# Patient Record
Sex: Female | Born: 1975 | Race: White | Hispanic: No | Marital: Married | Smoking: Former smoker
Health system: Southern US, Community
[De-identification: ages and names within clinical notes are randomized; demographics above are authoritative.]

## PROBLEM LIST (undated history)

## (undated) DIAGNOSIS — O24419 Gestational diabetes mellitus in pregnancy, unspecified control: Secondary | ICD-10-CM

## (undated) DIAGNOSIS — G4730 Sleep apnea, unspecified: Secondary | ICD-10-CM

## (undated) DIAGNOSIS — F419 Anxiety disorder, unspecified: Secondary | ICD-10-CM

## (undated) DIAGNOSIS — I10 Essential (primary) hypertension: Secondary | ICD-10-CM

## (undated) DIAGNOSIS — E669 Obesity, unspecified: Secondary | ICD-10-CM

## (undated) DIAGNOSIS — O1490 Unspecified pre-eclampsia, unspecified trimester: Secondary | ICD-10-CM

## (undated) DIAGNOSIS — I4819 Other persistent atrial fibrillation: Secondary | ICD-10-CM

## (undated) DIAGNOSIS — E282 Polycystic ovarian syndrome: Secondary | ICD-10-CM

## (undated) HISTORY — PX: OVARIAN CYST REMOVAL: SHX89

## (undated) HISTORY — PX: SALPINGOOPHORECTOMY: SHX82

---

## 2005-05-25 ENCOUNTER — Ambulatory Visit: Payer: Self-pay | Admitting: Obstetrics & Gynecology

## 2005-10-12 ENCOUNTER — Ambulatory Visit: Payer: Self-pay | Admitting: Internal Medicine

## 2005-10-15 ENCOUNTER — Ambulatory Visit: Payer: Self-pay | Admitting: Internal Medicine

## 2005-11-14 ENCOUNTER — Ambulatory Visit: Payer: Self-pay | Admitting: Internal Medicine

## 2007-03-10 ENCOUNTER — Emergency Department: Payer: Self-pay | Admitting: Emergency Medicine

## 2007-03-10 IMAGING — US US PELV - US TRANSVAGINAL
1 series · 17 of 25 positions shown · non-contrast
Comparison: none

REASON FOR EXAM: heavy vaginal bleeding + wbc elevation evalute uterus,
ovaries
COMMENTS:   LMP: Now

[Series 1: us pelv - us transvaginal · 17 of 27 slices shown]
[im 1/27]
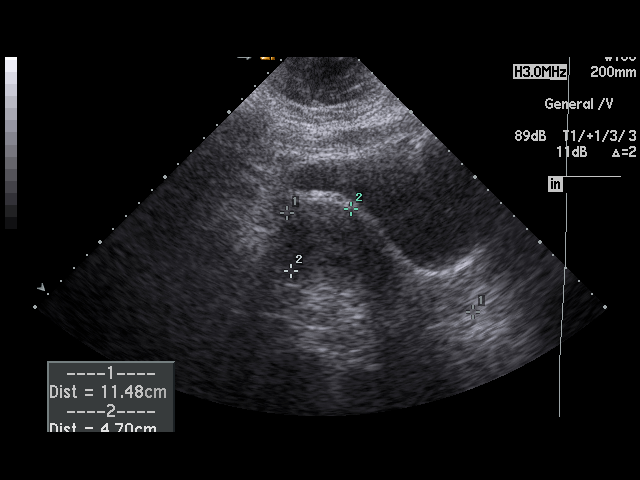
[im 3/27]
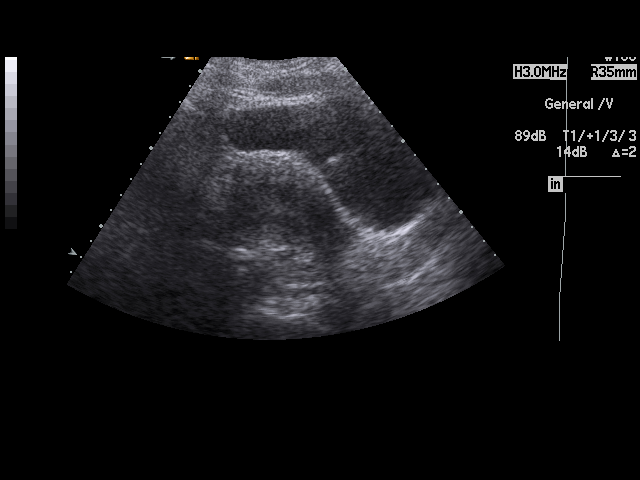
[im 4/27]
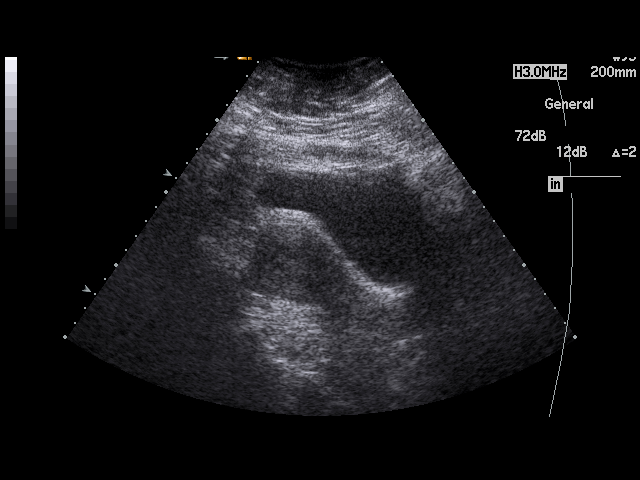
[im 6/27]
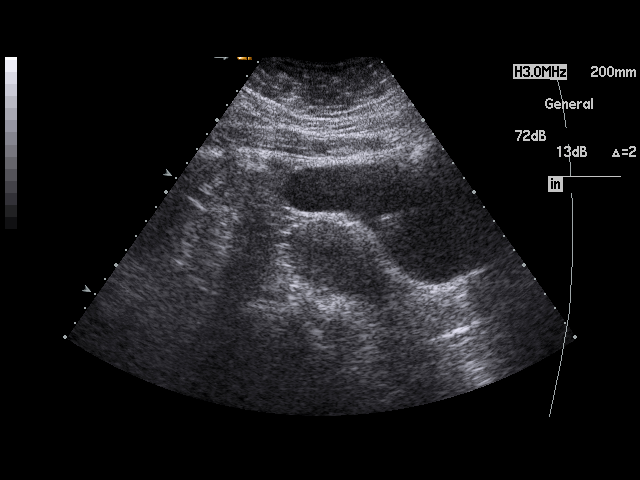
[im 7/27]
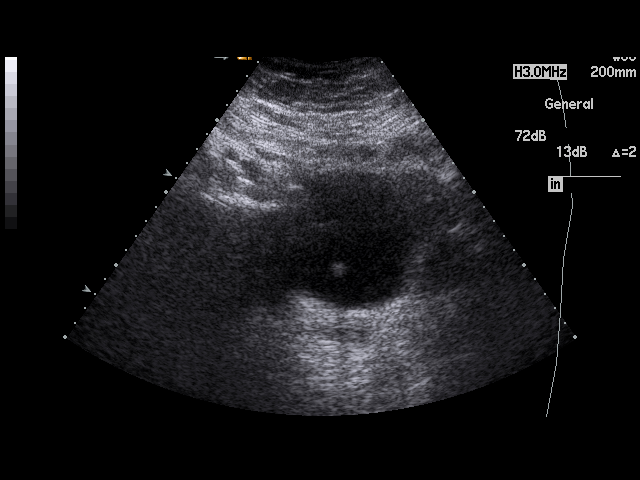
[im 9/27]
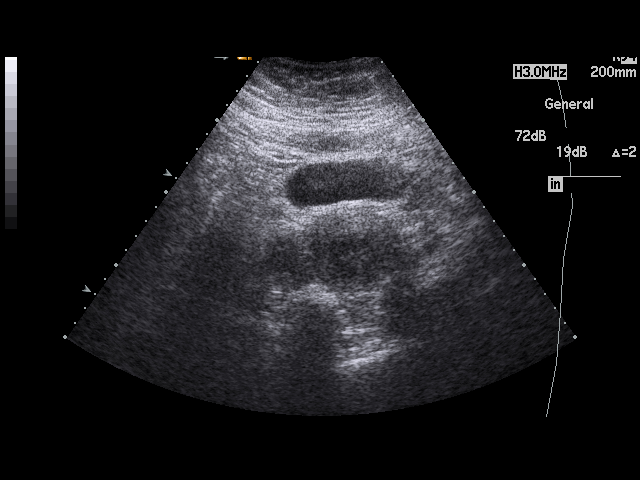
[im 10/27]
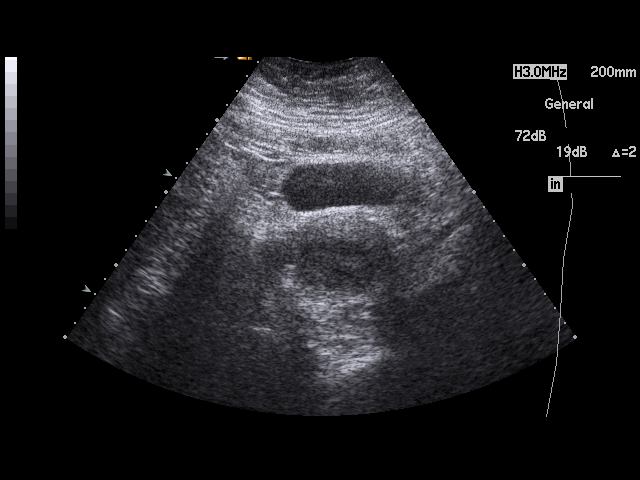
[im 12/27]
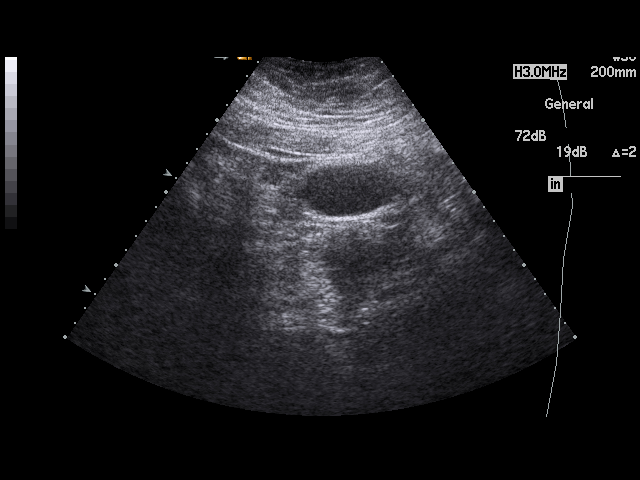
[im 14/27]
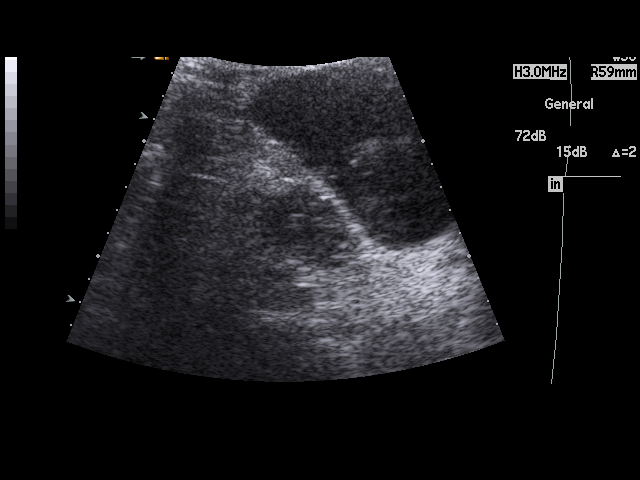
[im 15/27]
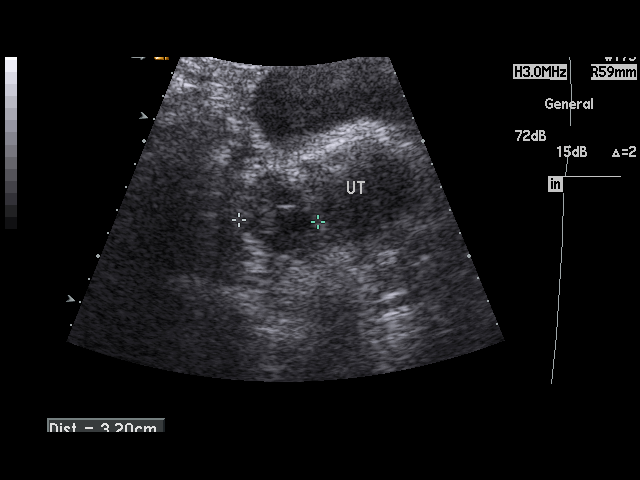
[im 17/27]
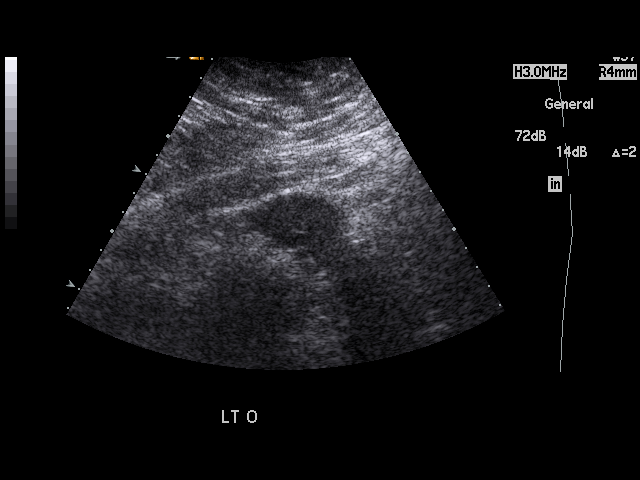
[im 18/27]
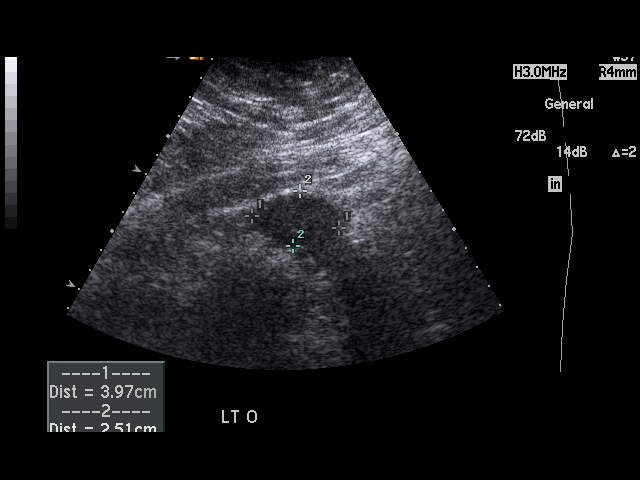
[im 20/27]
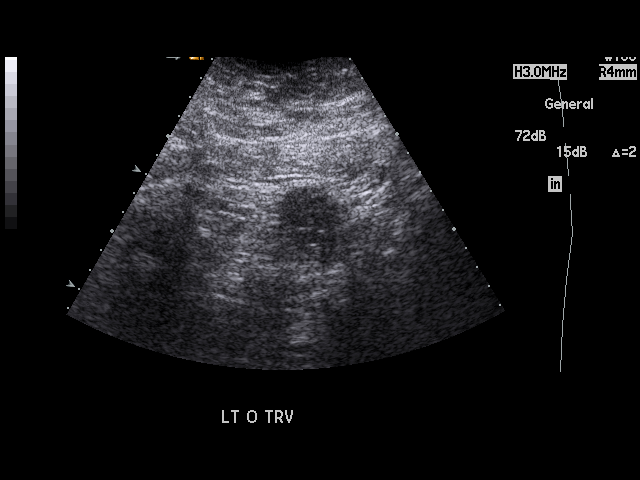
[im 21/27]
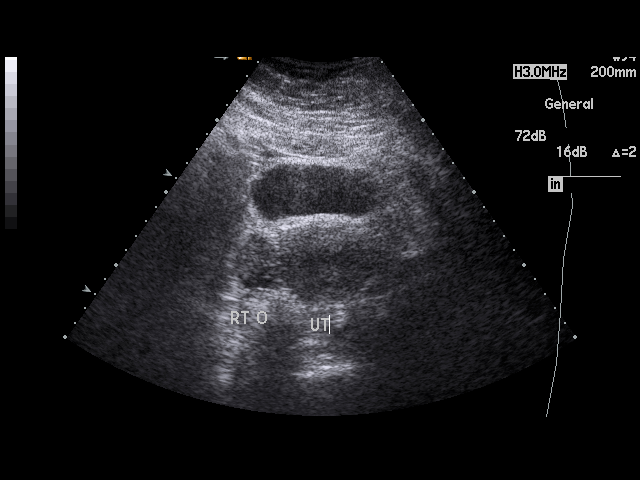
[im 23/27]
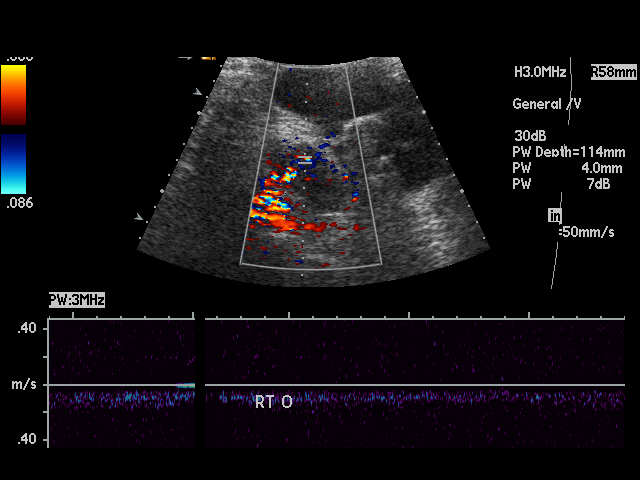
[im 24/27]
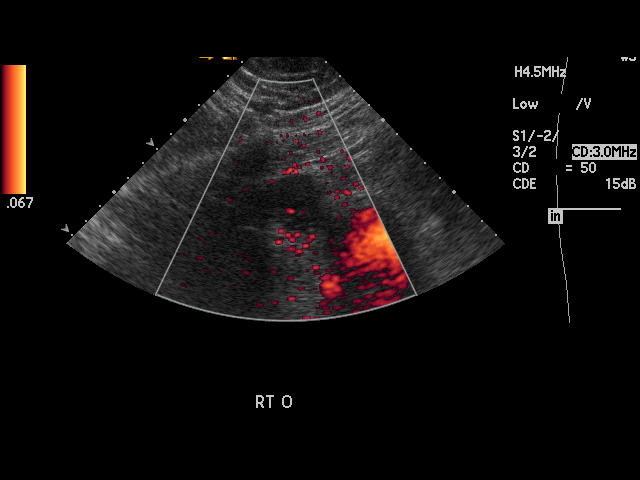
[im 27/27]
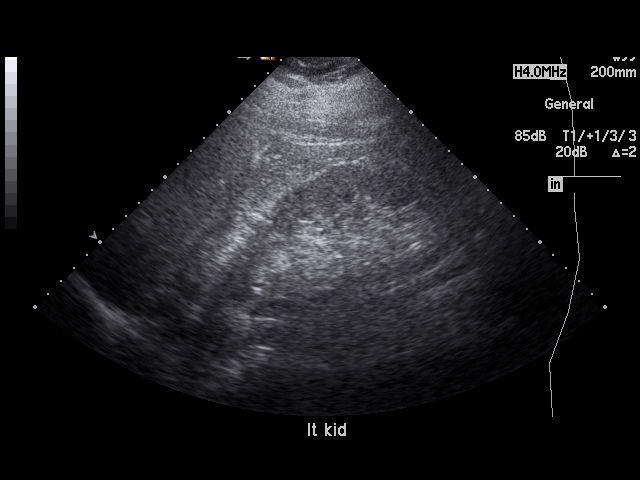

[17 of 25 positions shown; findings below may reference images not displayed]

PROCEDURE:     US  - US PELVIS MASS EXAM  - [DATE] [DATE] [DATE]  [DATE]

RESULT:     The size, shape and echotexture of the uterus is normal.  The
uterus measures 11.4 x 4.7 x 4.8 cm.  The ovaries are unremarkable measuring
4 x 2.7 x 3.2 cm on the RIGHT and 4.0 x 2.5 x 2.7 cm on the LEFT. There is
no hydronephrosis. No pelvic fluid collections are noted.
IMPRESSION: Normal pelvic ultrasound.

## 2011-04-19 ENCOUNTER — Emergency Department: Payer: Self-pay | Admitting: *Deleted

## 2011-05-18 ENCOUNTER — Ambulatory Visit: Payer: Self-pay | Admitting: Family

## 2020-08-21 ENCOUNTER — Encounter: Payer: Self-pay | Admitting: Emergency Medicine

## 2020-08-21 ENCOUNTER — Emergency Department: Payer: BC Managed Care – PPO

## 2020-08-21 ENCOUNTER — Observation Stay
Admission: EM | Admit: 2020-08-21 | Discharge: 2020-08-22 | Disposition: A | Discharge status: Home / Self Care | Payer: BC Managed Care – PPO | Attending: Internal Medicine | Admitting: Internal Medicine

## 2020-08-21 ENCOUNTER — Other Ambulatory Visit: Payer: Self-pay

## 2020-08-21 ENCOUNTER — Ambulatory Visit
Admission: EM | Admit: 2020-08-21 | Discharge: 2020-08-21 | Disposition: A | Discharge status: Other Acute Inpatient Hospital | Payer: BC Managed Care – PPO

## 2020-08-21 DIAGNOSIS — E876 Hypokalemia: Secondary | ICD-10-CM | POA: Diagnosis not present

## 2020-08-21 DIAGNOSIS — Z9189 Other specified personal risk factors, not elsewhere classified: Secondary | ICD-10-CM | POA: Diagnosis not present

## 2020-08-21 DIAGNOSIS — R079 Chest pain, unspecified: Secondary | ICD-10-CM | POA: Diagnosis not present

## 2020-08-21 DIAGNOSIS — I248 Other forms of acute ischemic heart disease: Secondary | ICD-10-CM | POA: Diagnosis not present

## 2020-08-21 DIAGNOSIS — I160 Hypertensive urgency: Secondary | ICD-10-CM | POA: Diagnosis present

## 2020-08-21 DIAGNOSIS — Z79899 Other long term (current) drug therapy: Secondary | ICD-10-CM | POA: Insufficient documentation

## 2020-08-21 DIAGNOSIS — I4891 Unspecified atrial fibrillation: Secondary | ICD-10-CM

## 2020-08-21 DIAGNOSIS — Z20822 Contact with and (suspected) exposure to covid-19: Secondary | ICD-10-CM | POA: Diagnosis not present

## 2020-08-21 DIAGNOSIS — I161 Hypertensive emergency: Secondary | ICD-10-CM

## 2020-08-21 DIAGNOSIS — D72829 Elevated white blood cell count, unspecified: Secondary | ICD-10-CM

## 2020-08-21 DIAGNOSIS — R0609 Other forms of dyspnea: Secondary | ICD-10-CM | POA: Diagnosis present

## 2020-08-21 DIAGNOSIS — E119 Type 2 diabetes mellitus without complications: Secondary | ICD-10-CM | POA: Insufficient documentation

## 2020-08-21 DIAGNOSIS — Z87891 Personal history of nicotine dependence: Secondary | ICD-10-CM | POA: Diagnosis not present

## 2020-08-21 DIAGNOSIS — R0600 Dyspnea, unspecified: Secondary | ICD-10-CM

## 2020-08-21 DIAGNOSIS — R0602 Shortness of breath: Secondary | ICD-10-CM

## 2020-08-21 DIAGNOSIS — Z28310 Unvaccinated for covid-19: Secondary | ICD-10-CM | POA: Insufficient documentation

## 2020-08-21 DIAGNOSIS — I10 Essential (primary) hypertension: Secondary | ICD-10-CM | POA: Insufficient documentation

## 2020-08-21 DIAGNOSIS — E282 Polycystic ovarian syndrome: Secondary | ICD-10-CM

## 2020-08-21 HISTORY — DX: Obesity, unspecified: E66.9

## 2020-08-21 HISTORY — DX: Sleep apnea, unspecified: G47.30

## 2020-08-21 HISTORY — DX: Unspecified pre-eclampsia, unspecified trimester: O14.90

## 2020-08-21 HISTORY — DX: Essential (primary) hypertension: I10

## 2020-08-21 HISTORY — DX: Polycystic ovarian syndrome: E28.2

## 2020-08-21 HISTORY — DX: Other persistent atrial fibrillation: I48.19

## 2020-08-21 HISTORY — DX: Anxiety disorder, unspecified: F41.9

## 2020-08-21 HISTORY — DX: Gestational diabetes mellitus in pregnancy, unspecified control: O24.419

## 2020-08-21 LAB — CBC
HCT: 44.1 % (ref 36.0–46.0)
Hemoglobin: 14.2 g/dL (ref 12.0–15.0)
MCH: 26.4 pg (ref 26.0–34.0)
MCHC: 32.2 g/dL (ref 30.0–36.0)
MCV: 82 fL (ref 80.0–100.0)
Platelets: 443 10*3/uL — ABNORMAL HIGH (ref 150–400)
RBC: 5.38 MIL/uL — ABNORMAL HIGH (ref 3.87–5.11)
RDW: 14.9 % (ref 11.5–15.5)
WBC: 17.9 10*3/uL — ABNORMAL HIGH (ref 4.0–10.5)
nRBC: 0 % (ref 0.0–0.2)

## 2020-08-21 LAB — BASIC METABOLIC PANEL
Anion gap: 12 (ref 5–15)
BUN: 15 mg/dL (ref 6–20)
CO2: 23 mmol/L (ref 22–32)
Calcium: 8.9 mg/dL (ref 8.9–10.3)
Chloride: 104 mmol/L (ref 98–111)
Creatinine, Ser: 0.9 mg/dL (ref 0.44–1.00)
GFR, Estimated: >60 mL/min (ref >60)
Glucose, Bld: 123 mg/dL — ABNORMAL HIGH (ref 70–99)
Potassium: 3.7 mmol/L (ref 3.5–5.1)
Sodium: 139 mmol/L (ref 135–145)

## 2020-08-21 LAB — TROPONIN I (HIGH SENSITIVITY)
Troponin I (High Sensitivity): 50 ng/L — ABNORMAL HIGH (ref <18)
Troponin I (High Sensitivity): 49 ng/L — ABNORMAL HIGH (ref <18)

## 2020-08-21 LAB — RESP PANEL BY RT-PCR (FLU A&B, COVID) ARPGX2
Influenza A by PCR: NEGATIVE
Influenza B by PCR: NEGATIVE
SARS Coronavirus 2 by RT PCR: NEGATIVE

## 2020-08-21 LAB — D-DIMER, QUANTITATIVE: D-Dimer, Quant: 1.11 ug/mL-FEU — ABNORMAL HIGH (ref 0.00–0.50)

## 2020-08-21 LAB — POC URINE PREG, ED: Preg Test, Ur: NEGATIVE

## 2020-08-21 LAB — BRAIN NATRIURETIC PEPTIDE: B Natriuretic Peptide: 483.7 pg/mL — ABNORMAL HIGH (ref 0.0–100.0)

## 2020-08-21 LAB — URINALYSIS, COMPLETE (UACMP) WITH MICROSCOPIC
Bacteria, UA: NONE SEEN
Bilirubin Urine: NEGATIVE
Glucose, UA: 50 mg/dL — AB
Ketones, ur: NEGATIVE mg/dL
Leukocytes,Ua: NEGATIVE
Nitrite: NEGATIVE
Protein, ur: >=300 mg/dL — AB
Specific Gravity, Urine: 1.008 (ref 1.005–1.030)
pH: 7 (ref 5.0–8.0)

## 2020-08-21 LAB — PROCALCITONIN: Procalcitonin: <0.1 ng/mL

## 2020-08-21 LAB — HIV ANTIBODY (ROUTINE TESTING W REFLEX): HIV Screen 4th Generation wRfx: NONREACTIVE

## 2020-08-21 LAB — T4, FREE: Free T4: 1.12 ng/dL (ref 0.61–1.12)

## 2020-08-21 LAB — TSH: TSH: 3.609 u[IU]/mL (ref 0.350–4.500)

## 2020-08-21 MED ORDER — ENOXAPARIN SODIUM 80 MG/0.8ML ~~LOC~~ SOLN
0.5000 mg/kg | SUBCUTANEOUS | Status: DC
  Administered 2020-08-21: 72.5 mg via SUBCUTANEOUS
  Filled 2020-08-21 (×2): qty 0.8

## 2020-08-21 MED ORDER — ACETAMINOPHEN 325 MG PO TABS: 650.0000 mg | ORAL_TABLET | Freq: Four times a day (QID) | ORAL | Status: DC | PRN

## 2020-08-21 MED ORDER — ONDANSETRON HCL 4 MG/2ML IJ SOLN: 4.0000 mg | Freq: Four times a day (QID) | INTRAMUSCULAR | Status: DC | PRN

## 2020-08-21 MED ORDER — DILTIAZEM HCL-DEXTROSE 125-5 MG/125ML-% IV SOLN (PREMIX)
5.0000 mg/h | INTRAVENOUS | Status: DC
  Administered 2020-08-21: 5 mg/h via INTRAVENOUS
  Administered 2020-08-22: 12.5 mg/h via INTRAVENOUS
  Filled 2020-08-21 (×2): qty 125

## 2020-08-21 MED ORDER — ADULT MULTIVITAMIN W/MINERALS CH
1.0000 | ORAL_TABLET | Freq: Every day | ORAL | Status: DC
  Administered 2020-08-21 – 2020-08-22 (×2): 1 via ORAL
  Filled 2020-08-21 (×2): qty 1

## 2020-08-21 MED ORDER — ONDANSETRON HCL 4 MG PO TABS: 4.0000 mg | ORAL_TABLET | Freq: Four times a day (QID) | ORAL | Status: DC | PRN

## 2020-08-21 MED ORDER — ACETAMINOPHEN 650 MG RE SUPP: 650.0000 mg | Freq: Four times a day (QID) | RECTAL | Status: DC | PRN

## 2020-08-21 MED ORDER — FUROSEMIDE 10 MG/ML IJ SOLN
40.0000 mg | Freq: Once | INTRAMUSCULAR | Status: AC
  Administered 2020-08-21: 40 mg via INTRAVENOUS
  Filled 2020-08-21: qty 4

## 2020-08-21 MED ORDER — LISINOPRIL 20 MG PO TABS
20.0000 mg | ORAL_TABLET | Freq: Every day | ORAL | Status: DC
  Administered 2020-08-21 – 2020-08-22 (×2): 20 mg via ORAL
  Filled 2020-08-21: qty 2
  Filled 2020-08-21: qty 1

## 2020-08-21 MED ORDER — HYDRALAZINE HCL 20 MG/ML IJ SOLN: 10.0000 mg | Freq: Once | INTRAMUSCULAR | Status: DC

## 2020-08-21 MED ORDER — LABETALOL HCL 5 MG/ML IV SOLN: 10.0000 mg | INTRAVENOUS | Status: DC | PRN

## 2020-08-21 NOTE — ED Triage Notes (Signed)
Pt BIB EMS from [facility], pt endorses shortness of breath x3 days, started with chest pressure this morning. In urgent care pt found to be in new onset a-fib with RVR with HR of 160-190. Pt given 5mg  of metoprolol on EMS arrival with improvement in HR to 160-190s. Pt's HR 120-140s on arrival to ED. Pt also found to be hypertensive with EMS at 260/150, pt has hx of HTN and has been off BP medications since December. Pt states she ran out of BP medications and never went back to primary to refill.

## 2020-08-21 NOTE — H&P (Addendum)
History and Physical   Joyce Perkins XLK:[id number] DOB: September 08, 1975 DOA: 08/21/2020  PCP: [facility] [employer]  Patient coming from: urgent care center  I have personally briefly reviewed patient's old medical records in [place].  Chief Concern: Shortness of breath  HPI: Joyce Perkins is a 45 y.o. female with medical history significant for history of HELLP syndrome, history of chronic hypertension with superimposed pre-eclampsia, PCOS, history of 2 cesarean section deliveries (2006 and 2014), postpartum depression, morbid obesity with BMI greater than 50, hypertension, history of chronic persistent leukocytosis post delivery, history of NIDDM and gestational DM, history of tobacco use quit when she was 45 years old, presents to the emergency department for chief concerns of shortness of breath.  She reports the shortness of breath has been present for 3 days.  She states it is worse with exertion including daily activities around the house.  She denies bilateral lower extremity swelling.  She reports that previously she lost 65 pounds intentionally and then gained all 65 pounds back.  Furthermore she endorses hair loss and takes biotin.  She states she was worked up for hypothyroid and that was negative.  She endorses polyuria and denies dysuria.  She states she is sexually active.  She denies chest pain, vision changes, fever, cough, abdominal pain, diarrhea, dysuria.  She does endorse a headache that has been persistent since this morning.  Since being in the emergency department and after medication initiation she states that the headache has improved.  Of note, she endorses that she stopped taking her blood pressure in December 2021(combination lisinopril - hctz 10-12.5 mg daily) and her depression medication, zoloft 50 mg daily. She states the reason being she didn't go back to her doctor to priortized her health and once her refill ran out, she stopped taking her  medications.  She denies SI and HI.  At bedside patient was able to tell me her name, her age, current location she does not appear to be in acute distress.  She is tearful when I discussed extensively how important it is for her to be compliant with her blood pressure medication.  She was able to move all extremities without any difficulty.  No slurred speech or facial asymmetry noted.  Social history: Lives with husband and two children. quit smoking 10 years old, 1 pack per week back then. She had one infrequent etoh use, last drink was over the weekend, 1 strawberry daiquiri. 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   Vaccination: She is not vaccinated for covid-19   ROS: Constitutional: no weight change, no fever ENT/Mouth: no sore throat, no rhinorrhea Eyes: no eye pain, no vision changes Cardiovascular: no chest pain, + dyspnea,  no edema, + palpitations Respiratory: no cough, no sputum, no wheezing Gastrointestinal: no nausea, no vomiting, no diarrhea, no constipation Genitourinary: no urinary incontinence, no dysuria, no hematuria, + polyuria Musculoskeletal: no arthralgias, no myalgias Skin: no skin lesions, no pruritus, Neuro: + weakness, no loss of consciousness, no syncope Psych: no anxiety, no depression, no decrease appetite Heme/Lymph: no bruising, no bleeding  ED Course: Discussed with ED provider, patient requiring hospitalization for hypertensive urgency and atrial fibrillation with RVR.  Vitals in the emergency department was markable for temperature of 98.8, respiration rate of 27, heart rate 122, blood pressure 225/145, patient SPO2 saturation of 95% on room air.    Emergency medicine provider started patient on  Cardizem drip.  Labs in the emergency department was remarkable for sodium of 139, potassium 3.7, serum creatinine of 0.90, nonfasting blood glucose of 123, EGFR greater than 60, WBC 17.9, hemoglobin  14.2, platelets 443.  Assessment/Plan  Principal Problem:   Atrial fibrillation with RVR (HCC) Active Problems:   Hypertensive urgency   Essential hypertension   Leukocytosis   Obesity, morbid, BMI 50 or higher (HCC)   At risk for extreme obesity with alveolar hypoventilation   PCOS (polycystic ovarian syndrome)   # Atrial fibrillation with RVR-etiology work-up in progress, however likely patient stopped taking her blood pressure medicine and is now in hypertensive urgency Hypertensive urgency-secondary to patient not taking her medications since December 2021 -ED provider started patient on Cardizem drip, we will continue -Maintain goal heart rate of heart rate of 75-105 and/or SBP of 150-175 -Checking TSH, BNP, UA, D-dimer-we will order the appropriate imaging with abnormal labs -As needed medications: Labetalol 10 mg IV every 2 hours as needed for SBP greater than 180, 1 dose ordered -Admit to progressive cardiac, observation, with telemetry  # Hypertension - etiology is multifactorial including primary hypertension, morbid obesity, and endocrine dysfunction given PCOS -previously on combination lisinopril-HCTZ 10-12.5 mg daily -Resumed lisinopril 20 mg daily -If patient does not improve, would recommend consideration given to renal US artery duplex complete to assess for renal artery stenosis  # Elevated troponin-suspect secondary to hypertensive urgency and atrial fibrillation with RVR -Initial high-sensitivity troponin is 50, will continue to follow second troponin  # Elevated BNP - suspect heart failure component, lasix 40 mg IV once ordered  # Chronic leukocytosis-patient states she continued to have leukocytosis post delivery in 2006 and was followed previously by oncologist specialist, Dr. Lorina Rabon -WBC on presentation was 17.9 -WBC in 2012-2014 in [place] shows WBC levels ranging from 11.7-18.6 -However given atrial fibrillation with RVR and polyuria, I will check  UA and procal  # History of non-insulin-dependent diabetes mellitus/gestational diabetes-was previously taking glyburide 2.5 mg twice daily, recommend outpatient follow-up with PCP  # At risk for hypoventilation obesity syndrome-CPAP nightly ordered, would recommend patient follow with primary care provider closely for need of sleep study outpatient  # PCOS - outpatient obgyn follow-up  # Morbid obesity-recommend diet and exercise outpatient weight loss  # Persistent hair loss-checking TSH  # Healthcare maintenance counseling-extensive discussion regarding taking care of her own health, including healthy weight loss and taking blood pressure medication was done during this hospitalization -Patient endorses understanding, agreement, and compliance  Chart reviewed.   DVT prophylaxis: Enoxaparin 72.5 mg subcutaneous every 24 hours, TED hose Code Status: Full code Diet: Heart healthy Family Communication: No Disposition Plan: Pending clinical course Consults called: None at this time Admission status: Observation, progressive cardiac with telemetry  Past Medical History:  Diagnosis Date  . Hypertension   . Obesity   . Sleep apnea    Past Surgical History:  Procedure Laterality Date  . CESAREAN SECTION     x 2  . OVARIAN CYST REMOVAL Left   . SALPINGOOPHORECTOMY Left    Social History:  reports that she quit smoking about 20 years ago. She has never used smokeless tobacco. She reports previous alcohol use. She reports previous drug use.  No Known Allergies Family History  Problem Relation Age of Onset  . Hypertension Mother   . COPD Mother   . Diabetes Mother   . Heart disease Mother   . Other Father        unknown  medical history   Family history: Family history reviewed and not pertinent  Prior to Admission medications   Medication Sig Start Date End Date Taking? Authorizing Provider  BIOTIN PO Take 1 tablet by mouth daily.    [provider]  Multiple  Vitamin (MULTIVITAMIN) tablet Take 1 tablet by mouth daily.    [provider]  VITAMIN D PO Take 1 tablet by mouth daily.    [provider]   Physical Exam: Vitals:   08/21/20 1515 08/21/20 1530 08/21/20 1545 08/21/20 1550  BP:  (!) 174/95    Pulse:    (!) 109  Resp: (!) 27 (!) 26    Temp:      TempSrc:      SpO2: 94% 93% 99% 93%  Weight:      Height:       Constitutional: appears older than chronological age, NAD, calm, comfortable Eyes: PERRL, lids and conjunctivae normal ENMT: Mucous membranes are moist. Posterior pharynx clear of any exudate or lesions. Age-appropriate dentition. Hearing appropriate Neck: normal, supple, no masses, no thyromegaly Respiratory: clear to auscultation bilaterally, no wheezing, no crackles. Normal respiratory effort. No accessory muscle use.  Cardiovascular: Regular rate and rhythm, no murmurs / rubs / gallops. No extremity edema. 2+ pedal pulses. No carotid bruits.  Abdomen: Morbid obesity, no tenderness, no masses palpated, no hepatosplenomegaly. Bowel sounds positive.  Musculoskeletal: no clubbing / cyanosis. No joint deformity upper and lower extremities. Good ROM, no contractures, no atrophy. Normal muscle tone.  Skin: no rashes, lesions, ulcers. No induration Neurologic: Sensation intact. Strength 5/5 in all 4.  Psychiatric: Normal judgment and insight. Alert and oriented x 3. Normal mood.   EKG: independently reviewed, showing atrial fibrillation with rate of 147, QTc 428  Chest x-ray on Admission: I personally reviewed and I agree with radiologist reading as below.  DG Chest 2 View  Result Date: 08/21/2020 CLINICAL DATA:  Chest pressure.  New onset atrial fibrillation. EXAM: CHEST - 2 VIEW COMPARISON:  Non FINDINGS: Mild enlargement of the cardiopericardial silhouette with indistinct pulmonary vasculature suggesting pulmonary venous hypertension. No Kerley B lines are identified. Subtle thickening of the fissures on the  lateral projection. No blunting of the costophrenic angles. Lower thoracic spondylosis. IMPRESSION: 1. Mild enlargement of the cardiopericardial silhouette with pulmonary venous hypertension but no overt edema at this time. No pleural effusion. Electronically Signed   By: Van Clines M.D.   On: 08/21/2020 14:29   Labs on Admission: I have personally reviewed following labs  CBC: Recent Labs  Lab 08/21/20 1319  WBC 17.9*  HGB 14.2  HCT 44.1  MCV 82.0  PLT 993*   Basic Metabolic Panel: Recent Labs  Lab 08/21/20 1319  NA 139  K 3.7  CL 104  CO2 23  GLUCOSE 123*  BUN 15  CREATININE 0.90  CALCIUM 8.9   GFR: Estimated Creatinine Clearance: 113.3 mL/min (by C-G formula based on SCr of 0.9 mg/dL).  CRITICAL CARE Performed by: Criss Alvine  Total critical care time: 35 minutes  Critical care time was exclusive of separately billable procedures and treating other patients.  Critical care was necessary to treat or prevent imminent or life-threatening deterioration. Hypertensive urgency, atrial fibrillation with rvr, circulatory failure   Critical care was time spent personally by me on the following activities: development of treatment plan with patient and/or surrogate as well as nursing, discussions with consultants, evaluation of patient's response to treatment, examination of patient, obtaining history from patient or surrogate,  ordering and performing treatments and interventions, ordering and review of laboratory studies, ordering and review of radiographic studies, pulse oximetry and re-evaluation of patient's condition.  Germain Koopmann N Bridgitte Felicetti D.O. [facility]  If 7PM-7AM, please contact overnight-coverage provider If 7AM-7PM, please contact day coverage provider [email or web address]  08/21/2020, 3:58 PM

## 2020-08-21 NOTE — ED Notes (Signed)
Patient is being discharged from the Urgent Care and sent to the Emergency Department via EMS. Per Athena Masse, PA, patient is in need of higher level of care due to Afib with RVR and elevated BP of 260/98. Patient is aware and verbalizes understanding of plan of care.  Vitals:   08/21/20 1207 08/21/20 1217  BP:    Pulse: (S) (!) 151   Resp:    Temp:    SpO2: 93% 97%

## 2020-08-21 NOTE — ED Notes (Addendum)
Pt requesting to use restroom, pt advised that if possible should wait until HR more stable.

## 2020-08-21 NOTE — ED Provider Notes (Addendum)
[facility]    CSN: [id number] Arrival date & time: 08/21/20  1138      History   Chief Complaint Chief Complaint  Patient presents with  . Shortness of Breath    HPI Joyce Perkins is a 45 y.o. female presenting for 3-day history of fatigue and dyspnea on exertion.  Patient states that she has not felt well for a while before that but everything seems to have climax over the past 3 days and especially today.  She says earlier today she felt like her heart was racing.  She is also had some "chest pressure" throughout the day today.  Additionally she has felt some numbness and tingling in her hands and toes.  Patient also admits to a headache currently.  She denies any recent illness.  No fever, cough, congestion, abdominal pain, nausea/vomiting.  Symptoms do improve a little bit when she sits and rests.  She does have a medical history significant for hypertension but has not been on medication for approximately 5 months.  Other history significant for OSA and morbid obesity.  Patient states that she does sleep with the BiPAP and this morning she still woke up gasping for breath.  Patient has no history of arrhythmia or cardiac problems.  No history of COPD but is a former smoker.  No history of asthma.  Patient has no other concerns.  HPI  Past Medical History:  Diagnosis Date  . Hypertension   . Obesity   . Sleep apnea     There are no problems to display for this patient.   Past Surgical History:  Procedure Laterality Date  . CESAREAN SECTION     x 2  . OVARIAN CYST REMOVAL Left   . SALPINGOOPHORECTOMY Left     OB History   No obstetric history on file.      Home Medications    Prior to Admission medications   Medication Sig Start Date End Date Taking? Authorizing Provider  BIOTIN PO Take 1 tablet by mouth daily.   Yes [provider]  Multiple Vitamin (MULTIVITAMIN) tablet Take 1 tablet by mouth daily.   Yes [provider]  VITAMIN  D PO Take 1 tablet by mouth daily.   Yes [provider]    Family History Family History  Problem Relation Age of Onset  . Hypertension Mother   . COPD Mother   . Diabetes Mother   . Heart disease Mother   . Other Father        unknown medical history    Social History Social History   Tobacco Use  . Smoking status: Former Smoker    Quit date: 08/21/2000    Years since quitting: 20.0  . Smokeless tobacco: Never Used  Vaping Use  . Vaping Use: Never used  Substance Use Topics  . Alcohol use: Not Currently  . Drug use: Not Currently     Allergies   Patient has no known allergies.   Review of Systems Review of Systems  Constitutional: Positive for diaphoresis and fatigue. Negative for fever.  HENT: Negative for congestion.   Eyes: Negative for photophobia and visual disturbance.  Respiratory: Positive for shortness of breath. Negative for cough and wheezing.   Cardiovascular: Positive for chest pain and palpitations. Negative for leg swelling.  Gastrointestinal: Negative for abdominal pain, nausea and vomiting.  Musculoskeletal: Positive for back pain (yesterday, but not currently).  Neurological: Positive for numbness and headaches. Negative for dizziness, syncope and weakness.  Physical Exam Triage Vital Signs ED Triage Vitals  Enc Vitals Group     BP 08/21/20 1159 (!) 260/98     Pulse Rate 08/21/20 1159 94     Resp 08/21/20 1159 (!) 24     Temp 08/21/20 1159 98.1 F (36.7 C)     Temp Source 08/21/20 1159 Oral     SpO2 08/21/20 1159 99 %     Weight 08/21/20 1153 (!) 315 lb (142.9 kg)     Height 08/21/20 1153 5\' 4"  (1.626 m)     Head Circumference --      Peak Flow --      Pain Score 08/21/20 1153 0     Pain Loc --      Pain Edu? --      Excl. in GC? --    No data found.  Updated Vital Signs BP (!) 260/98 (BP Location: Right Arm) Comment (BP Location): forearm  Pulse (S) (!) 151 Comment: with ambulation  Temp 98.1 F (36.7 C) (Oral)    Resp (!) 24   Ht 5\' 4"  (1.626 m)   Wt (!) 315 lb (142.9 kg)   LMP 07/21/2020 (Exact Date)   SpO2 97%   BMI 54.07 kg/m       Physical Exam Vitals and nursing note reviewed.  Constitutional:      General: She is in acute distress (mild to moderate distress related to increased RR as well as being very tearful and "scared").     Appearance: Normal appearance. She is well-developed. She is obese. She is diaphoretic. She is not ill-appearing or toxic-appearing.  HENT:     Head: Normocephalic and atraumatic.     Nose: Nose normal.     Mouth/Throat:     Mouth: Mucous membranes are moist.     Pharynx: Oropharynx is clear.  Eyes:     General: No scleral icterus.       Right eye: No discharge.        Left eye: No discharge.     Conjunctiva/sclera: Conjunctivae normal.  Cardiovascular:     Rate and Rhythm: Tachycardia present. Rhythm irregular.     Heart sounds: Normal heart sounds. No murmur heard.   Pulmonary:     Effort: Pulmonary effort is normal. No respiratory distress.     Breath sounds: Normal breath sounds. No wheezing, rhonchi or rales.  Musculoskeletal:     Cervical back: Neck supple.  Neurological:     General: No focal deficit present.     Mental Status: She is alert and oriented to person, place, and time. Mental status is at baseline.     Cranial Nerves: No cranial nerve deficit.     Motor: No weakness.     Gait: Gait normal.  Psychiatric:        Mood and Affect: Mood normal.        Behavior: Behavior normal.        Thought Content: Thought content normal.      UC Treatments / Results  Labs (all labs ordered are listed, but only abnormal results are displayed) Labs Reviewed - No data to display  EKG   Radiology No results found.  Procedures ED EKG  Date/Time: 08/21/2020 12:48 PM Performed by: 09/20/2020, PA-C Authorized by: 10/21/2020, PA-C   Previous ECG:    Previous ECG:  Unavailable Interpretation:    Interpretation: abnormal    Rate:    ECG rate:  147   ECG rate assessment:  tachycardic   Rhythm:    Rhythm: atrial fibrillation   Ectopy:    Ectopy: none   QRS:    QRS axis:  Normal   QRS intervals:  Normal ST segments:    ST segments:  Non-specific T waves:    T waves: normal   Comments:     Atrial fibrillation in RVR, tachycardia, Nonspecific ST abnormality   (including critical care time)  Medications Ordered in UC Medications - No data to display  Initial Impression / Assessment and Plan / UC Course  I have reviewed the triage vital signs and the nursing notes.  Pertinent labs & imaging results that were available during my care of the patient were reviewed by me and considered in my medical decision making (see chart for details).   45 year old female presenting in distress for shortness of breath and dyspnea on exertion as well as fatigue x3 days, worsening today.  Has had some chest pressure today.  She also admits to feeling very anxious.  Blood pressure is 260/98.  Heart rate fluctuates between 120s and 160s.  Oxygen is initially 99% at rest but dropped to 93% with walking 50 feet.  Heart rate irregularly irregular and tachycardic.Her chest is clear to auscultation.  EKG performed today shows atrial fibrillation with RVR and ST abnormality.  Heart rate 147 bpm.  Advised patient that she is in atrial fibrillation and a hypertensive crisis.  Advised her that she needs to go to the emergency department at this time via EMS for further work-up and management.  Patient initially does not want to go by EMS but advised her that it is unsafe for her to take her cell phone for someone else to take her given that she is not stable with her symptoms and vitals with ER.  She does finally agree after speaking with her husband who is going home to take care of their children.  Nursing staff started IV in the left hand.  EMS was called and I gave report to EMS.  Patient leaving in mostly stable condition by EMS  in route to [facility].   Final Clinical Impressions(s) / UC Diagnoses   Final diagnoses:  Atrial fibrillation with RVR (HCC)  SOB (shortness of breath)  Hypertensive emergency  Chest pain, unspecified type     Discharge Instructions     You have been advised to follow up immediately in the emergency department for concerning signs.symptoms. If you declined EMS transport, please have a family member take you directly to the ED at this time. Do not delay. Based on concerns about condition, if you do not follow up in th e ED, you may risk poor outcomes including worsening of condition, delayed treatment and potentially life threatening issues. If you have declined to go to the ED at this time, you should call your PCP immediately to set up a follow up appointment.  Go to ED for red flag symptoms, including; fevers you cannot reduce with Tylenol/Motrin, severe headaches, vision changes, numbness/weakness in part of the body, lethargy, confusion, intractable vomiting, severe dehydration, chest pain, breathing difficulty, severe persistent abdominal or pelvic pain, signs of severe infection (increased redness, swelling of an area), feeling faint or passing out, dizziness, etc. You should especially go to the ED for sudden acute worsening of condition if you do not elect to go at this time.     ED Prescriptions    None     PDMP not reviewed this encounter.   Shirlee Latch,  PA-C 08/21/20 1250    Eusebio FriendlyEaves, Erandy Mceachern B, PA-C 08/21/20 1250

## 2020-08-21 NOTE — ED Provider Notes (Signed)
[facility] Emergency Department Provider Note   ____________________________________________   Event Date/Time   First MD Initiated Contact with Patient 08/21/20 1325     (approximate)  I have reviewed the triage vital signs and the nursing notes.   HISTORY  Chief Complaint Palpitations    HPI Joyce Perkins is a 45 y.o. female with a past medical history of morbid obesity, hypertension, and sleep apnea who presents from urgent care after 3 days of fatigue and dyspnea on exertion.  Patient also endorses chest pressure and palpitations.  Also endorses associated numbness and tingling in her fingers and toes intermittently.  Patient currently denies any vision changes, tinnitus, difficulty speaking, facial droop, sore throat, abdominal pain, nausea/vomiting/diarrhea, dysuria, or weakness/numbness/paresthesias in any extremity         Past Medical History:  Diagnosis Date  . Hypertension   . Obesity   . Sleep apnea     Patient Active Problem List   Diagnosis Date Noted  . Hypertensive urgency 08/21/2020  . Essential hypertension 08/21/2020  . Leukocytosis 08/21/2020  . Atrial fibrillation with RVR (HCC) 08/21/2020  . Obesity, morbid, BMI 50 or higher (HCC) 08/21/2020  . At risk for extreme obesity with alveolar hypoventilation 08/21/2020  . PCOS (polycystic ovarian syndrome) 08/21/2020    Past Surgical History:  Procedure Laterality Date  . CESAREAN SECTION     x 2  . OVARIAN CYST REMOVAL Left   . SALPINGOOPHORECTOMY Left     Prior to Admission medications   Medication Sig Start Date End Date Taking? Authorizing Provider  BIOTIN PO Take 1 tablet by mouth daily.   Yes [provider]  ibuprofen (ADVIL) 200 MG tablet Take 200 mg by mouth every 6 (six) hours as needed for headache or mild pain.   Yes [provider]  Multiple Vitamin (MULTIVITAMIN) tablet Take 1 tablet by mouth daily.   Yes [provider]   VITAMIN D PO Take 1 tablet by mouth daily.   Yes [provider]  lisinopril-hydrochlorothiazide (ZESTORETIC) 10-12.5 MG tablet Take 1 tablet by mouth daily. 04/07/20   [provider]  sertraline (ZOLOFT) 50 MG tablet Take 50 mg by mouth daily. 04/11/13   [provider]    Allergies Patient has no known allergies.  Family History  Problem Relation Age of Onset  . Hypertension Mother   . COPD Mother   . Diabetes Mother   . Heart disease Mother   . Other Father        unknown medical history    Social History Social History   Tobacco Use  . Smoking status: Former Smoker    Quit date: 08/21/2000    Years since quitting: 20.0  . Smokeless tobacco: Never Used  Vaping Use  . Vaping Use: Never used  Substance Use Topics  . Alcohol use: Not Currently  . Drug use: Not Currently    Review of Systems Constitutional: No fever/chills Eyes: No visual changes. ENT: No sore throat. Cardiovascular: Endorses chest pain. Respiratory: Endorses shortness of breath. Gastrointestinal: No abdominal pain.  No nausea, no vomiting.  No diarrhea. Genitourinary: Negative for dysuria. Musculoskeletal: Negative for acute arthralgias Skin: Negative for rash. Neurological: Negative for headaches, weakness/numbness/paresthesias in any extremity Psychiatric: Negative for suicidal ideation/homicidal ideation   ____________________________________________   PHYSICAL EXAM:  VITAL SIGNS: ED Triage Vitals  Enc Vitals Group     BP 08/21/20 1313 (!) 163/120     Pulse Rate 08/21/20 1313 (!) 131  Resp 08/21/20 1313 (!) 22     Temp 08/21/20 1317 98.8 F (37.1 C)     Temp Source 08/21/20 1317 Oral     SpO2 08/21/20 1313 98 %     Weight 08/21/20 1315 (!) 315 lb (142.9 kg)     Height 08/21/20 1315 5\' 4"  (1.626 m)     Head Circumference --      Peak Flow --      Pain Score 08/21/20 1315 0     Pain Loc --      Pain Edu? --      Excl. in GC? --    Constitutional:  Alert and oriented. Well appearing morbidly obese middle-aged Caucasian female in no acute distress. Eyes: Conjunctivae are normal. PERRL. Head: Atraumatic. Nose: No congestion/rhinnorhea. Mouth/Throat: Mucous membranes are moist. Neck: No stridor Cardiovascular: Irregularly irregular tachycardic rhythm.  Good peripheral circulation. Respiratory: Normal respiratory effort.  No retractions. Gastrointestinal: Soft and nontender. No distention. Musculoskeletal: No obvious deformities Neurologic:  Normal speech and language. No gross focal neurologic deficits are appreciated. Skin:  Skin is warm and dry. No rash noted. Psychiatric: Mood and affect are normal. Speech and behavior are normal.  ____________________________________________   LABS (all labs ordered are listed, but only abnormal results are displayed)  Labs Reviewed  BASIC METABOLIC PANEL - Abnormal; Notable for the following components:      Result Value   Glucose, Bld 123 (*)    All other components within normal limits  CBC - Abnormal; Notable for the following components:   WBC 17.9 (*)    RBC 5.38 (*)    Platelets 443 (*)    All other components within normal limits  TROPONIN I (HIGH SENSITIVITY) - Abnormal; Notable for the following components:   Troponin I (High Sensitivity) 50 (*)    All other components within normal limits  RESP PANEL BY RT-PCR (FLU A&B, COVID) ARPGX2  T4, FREE  HIV ANTIBODY (ROUTINE TESTING W REFLEX)  TSH  PROCALCITONIN  BRAIN NATRIURETIC PEPTIDE  D-DIMER, QUANTITATIVE  URINALYSIS, COMPLETE (UACMP) WITH MICROSCOPIC  POC URINE PREG, ED   ____________________________________________  EKG  ED ECG REPORT I, 10/21/20, the attending physician, personally viewed and interpreted this ECG.  Date: 08/21/2020 EKG Time: 1319 Rate: 139 Rhythm: Atrial fibrillation with rapid ventricular response QRS Axis: normal Intervals: normal ST/T Wave abnormalities: normal Narrative  Interpretation: no evidence of acute ischemia  ____________________________________________  RADIOLOGY  ED MD interpretation: 2 view chest x-ray shows no evidence of acute abnormalities including no pneumonia, pneumothorax, or widened mediastinum  Official radiology report(s): DG Chest 2 View  Result Date: 08/21/2020 CLINICAL DATA:  Chest pressure.  New onset atrial fibrillation. EXAM: CHEST - 2 VIEW COMPARISON:  Non FINDINGS: Mild enlargement of the cardiopericardial silhouette with indistinct pulmonary vasculature suggesting pulmonary venous hypertension. No Kerley B lines are identified. Subtle thickening of the fissures on the lateral projection. No blunting of the costophrenic angles. Lower thoracic spondylosis. IMPRESSION: 1. Mild enlargement of the cardiopericardial silhouette with pulmonary venous hypertension but no overt edema at this time. No pleural effusion. Electronically Signed   By: 10/21/2020 M.D.   On: 08/21/2020 14:29    ____________________________________________   PROCEDURES  Procedure(s) performed (including Critical Care):  .1-3 Lead EKG Interpretation Performed by: 10/21/2020, MD Authorized by: Merwyn Katos, MD     Interpretation: abnormal     ECG rate:  115   ECG rate assessment: tachycardic     Rhythm: atrial  fibrillation     Ectopy: none     Conduction: normal   .Critical Care Performed by: Merwyn Katos, MD Authorized by: Merwyn Katos, MD   Critical care provider statement:    Critical care time (minutes):  35   Critical care time was exclusive of:  Separately billable procedures and treating other patients   Critical care was necessary to treat or prevent imminent or life-threatening deterioration of the following conditions:  Cardiac failure   Critical care was time spent personally by me on the following activities:  Discussions with consultants, evaluation of patient's response to treatment, examination of patient, ordering  and performing treatments and interventions, ordering and review of laboratory studies, ordering and review of radiographic studies, pulse oximetry, re-evaluation of patient's condition, obtaining history from patient or surrogate and review of old charts   I assumed direction of critical care for this patient from another provider in my specialty: no     Care discussed with: admitting provider       ____________________________________________   INITIAL IMPRESSION / ASSESSMENT AND PLAN / ED COURSE  As part of my medical decision making, I reviewed the following data within the electronic MEDICAL RECORD NUMBER Nursing notes reviewed and incorporated, Labs reviewed, EKG interpreted, Old chart reviewed, Radiograph reviewed and Notes from prior ED visits reviewed and incorporated     + atrial fibrillation w/ RVR DDx: Pneumothorax, Pneumonia, Pulmonary Embolus, Tamponade, ACS, Thyrotoxicosis.  No history or evidence decompensated heart failure. Given their history and exam it is likely this patient is unlikely to spontaneously revert to a rate controlled rhythm and necessitates a thorough workup for their arrhythmia. Workup: ECG, CXR, CBC, BMP, UA, Troponin, BNP, TSH, Ca-Mag-Phos Interventions: Defer Cardioversion (uncertain historical reliability with time of onset, increased risk of thromboembolic stroke).  Start diltiazem bolus and drip  Patients presentation not consistent with  Reassessment: Patient maintained NSR during multi-hour observation in ED. Disposition: Discharge home with prompt PCP follow up and cardiology referral.   Disposition: Admit      ____________________________________________   FINAL CLINICAL IMPRESSION(S) / ED DIAGNOSES  Final diagnoses:  Atrial fibrillation with RVR (HCC)  Dyspnea on exertion     ED Discharge Orders    None       Note:  This document was prepared using Dragon voice recognition software and may include unintentional dictation  errors.   Merwyn Katos, MD 08/21/20 [phone]

## 2020-08-21 NOTE — ED Triage Notes (Addendum)
Patient in today c/o sob x 3 days, worse with exertion. Patient denies any other symptoms. Patient states she sleeps with a Bi-pap and states she woke up this morning gasping for a breath. Patient states she had an old inhaler at home that had expired, but she used it because she was having such a hard time. Patient has also taken OTC Ibuprofen.

## 2020-08-21 NOTE — Progress Notes (Signed)
PHARMACIST - PHYSICIAN COMMUNICATION  CONCERNING:  Enoxaparin (Lovenox) for DVT Prophylaxis    RECOMMENDATION: Patient was prescribed enoxaparin 40mg  q24 hours for VTE prophylaxis.   Filed Weights   08/21/20 1315  Weight: (!) 142.9 kg (315 lb)    Body mass index is 54.07 kg/m.  Estimated Creatinine Clearance: 113.3 mL/min (by C-G formula based on SCr of 0.9 mg/dL).   Based on [facility] policy patient is candidate for enoxaparin 0.5mg /kg TBW SQ every 24 hours based on BMI being >30.  DESCRIPTION: Pharmacy has adjusted enoxaparin dose per [facility] policy.  Patient is now receiving enoxaparin 72.5 mg every 24 hours   [facility] 08/21/2020 3:03 PM

## 2020-08-21 NOTE — Discharge Instructions (Signed)

## 2020-08-21 NOTE — ED Notes (Signed)
Patient transported to X-ray by this RN  

## 2020-08-21 NOTE — ED Notes (Signed)
Assisted pt to toilet.  Urinated and Tolerated well.

## 2020-08-22 ENCOUNTER — Observation Stay (HOSPITAL_BASED_OUTPATIENT_CLINIC_OR_DEPARTMENT_OTHER)
Admit: 2020-08-22 | Discharge: 2020-08-22 | Disposition: A | Discharge status: Home / Self Care | Payer: BC Managed Care – PPO | Attending: Internal Medicine | Admitting: Internal Medicine

## 2020-08-22 ENCOUNTER — Observation Stay: Payer: BC Managed Care – PPO

## 2020-08-22 ENCOUNTER — Encounter: Payer: Self-pay | Admitting: Internal Medicine

## 2020-08-22 DIAGNOSIS — G4733 Obstructive sleep apnea (adult) (pediatric): Secondary | ICD-10-CM | POA: Diagnosis not present

## 2020-08-22 DIAGNOSIS — I4891 Unspecified atrial fibrillation: Secondary | ICD-10-CM

## 2020-08-22 DIAGNOSIS — E876 Hypokalemia: Secondary | ICD-10-CM | POA: Diagnosis not present

## 2020-08-22 DIAGNOSIS — R0600 Dyspnea, unspecified: Secondary | ICD-10-CM | POA: Diagnosis not present

## 2020-08-22 DIAGNOSIS — I160 Hypertensive urgency: Secondary | ICD-10-CM

## 2020-08-22 LAB — ECHOCARDIOGRAM COMPLETE
AV Mean grad: 8 mmHg
AV Peak grad: 15.8 mmHg
Ao pk vel: 1.99 m/s
Area-P 1/2: 3.3 cm2
Calc EF: 52.2 %
Height: 64 in
Single Plane A2C EF: 53.4 %
Single Plane A4C EF: 50.2 %
Weight: 5025.6 oz

## 2020-08-22 LAB — BASIC METABOLIC PANEL
Anion gap: 11 (ref 5–15)
BUN: 20 mg/dL (ref 6–20)
CO2: 25 mmol/L (ref 22–32)
Calcium: 8.6 mg/dL — ABNORMAL LOW (ref 8.9–10.3)
Chloride: 102 mmol/L (ref 98–111)
Creatinine, Ser: 0.9 mg/dL (ref 0.44–1.00)
GFR, Estimated: >60 mL/min (ref >60)
Glucose, Bld: 130 mg/dL — ABNORMAL HIGH (ref 70–99)
Potassium: 3.2 mmol/L — ABNORMAL LOW (ref 3.5–5.1)
Sodium: 138 mmol/L (ref 135–145)

## 2020-08-22 LAB — CBC
HCT: 39.5 % (ref 36.0–46.0)
Hemoglobin: 12.6 g/dL (ref 12.0–15.0)
MCH: 26.4 pg (ref 26.0–34.0)
MCHC: 31.9 g/dL (ref 30.0–36.0)
MCV: 82.6 fL (ref 80.0–100.0)
Platelets: 401 10*3/uL — ABNORMAL HIGH (ref 150–400)
RBC: 4.78 MIL/uL (ref 3.87–5.11)
RDW: 15 % (ref 11.5–15.5)
WBC: 15.8 10*3/uL — ABNORMAL HIGH (ref 4.0–10.5)
nRBC: 0 % (ref 0.0–0.2)

## 2020-08-22 MED ORDER — ENOXAPARIN SODIUM 150 MG/ML ~~LOC~~ SOLN
1.0000 mg/kg | Freq: Two times a day (BID) | SUBCUTANEOUS | Status: DC
  Filled 2020-08-22 (×2): qty 0.96

## 2020-08-22 MED ORDER — IOHEXOL 350 MG/ML SOLN
75.0000 mL | Freq: Once | INTRAVENOUS | Status: AC | PRN
  Administered 2020-08-22: 75 mL via INTRAVENOUS

## 2020-08-22 MED ORDER — APIXABAN 5 MG PO TABS
5.0000 mg | ORAL_TABLET | Freq: Two times a day (BID) | ORAL | Status: DC
  Administered 2020-08-22: 5 mg via ORAL
  Filled 2020-08-22: qty 1

## 2020-08-22 MED ORDER — PERFLUTREN LIPID MICROSPHERE
1.0000 mL | INTRAVENOUS | Status: AC | PRN
Start: 2020-08-22 — End: 2020-08-22
  Administered 2020-08-22: 2 mL via INTRAVENOUS
  Filled 2020-08-22: qty 10

## 2020-08-22 MED ORDER — DILTIAZEM HCL 30 MG PO TABS
60.0000 mg | ORAL_TABLET | Freq: Four times a day (QID) | ORAL | Status: DC
  Administered 2020-08-22: 60 mg via ORAL
  Filled 2020-08-22: qty 2

## 2020-08-22 MED ORDER — LISINOPRIL 20 MG PO TABS: 20.0000 mg | ORAL_TABLET | Freq: Every day | ORAL | 2 refills | Status: DC

## 2020-08-22 MED ORDER — APIXABAN 5 MG PO TABS: 5.0000 mg | ORAL_TABLET | Freq: Two times a day (BID) | ORAL | 2 refills | Status: DC

## 2020-08-22 MED ORDER — POTASSIUM CHLORIDE CRYS ER 20 MEQ PO TBCR
40.0000 meq | EXTENDED_RELEASE_TABLET | ORAL | Status: AC
  Administered 2020-08-22 (×2): 40 meq via ORAL
  Filled 2020-08-22 (×2): qty 2

## 2020-08-22 MED ORDER — METOPROLOL TARTRATE 50 MG PO TABS
50.0000 mg | ORAL_TABLET | Freq: Two times a day (BID) | ORAL | Status: DC
  Administered 2020-08-22: 50 mg via ORAL
  Filled 2020-08-22: qty 1

## 2020-08-22 MED ORDER — METOPROLOL TARTRATE 50 MG PO TABS: 50.0000 mg | ORAL_TABLET | Freq: Two times a day (BID) | ORAL | 2 refills | Status: DC

## 2020-08-22 NOTE — Discharge Instructions (Signed)
Atrial Fibrillation  Atrial fibrillation is a type of heartbeat that is irregular or fast. If you have this condition, your heart beats without any order. This makes it hard for your heart to pump blood in a normal way. Atrial fibrillation may come and go, or it may become a long-lasting problem. If this condition is not treated, it can put you at higher risk for stroke, heart failure, and other heart problems. What are the causes? This condition may be caused by diseases that damage the heart. They include:  High blood pressure.  Heart failure.  Heart valve disease.  Heart surgery. Other causes include:  Diabetes.  Thyroid disease.  Being overweight.  Kidney disease. Sometimes the cause is not known. What increases the risk? You are more likely to develop this condition if:  You are older.  You smoke.  You exercise often and very hard.  You have a family history of this condition.  You are a man.  You use drugs.  You drink a lot of alcohol.  You have lung conditions, such as emphysema, pneumonia, or COPD.  You have sleep apnea. What are the signs or symptoms? Common symptoms of this condition include:  A feeling that your heart is beating very fast.  Chest pain or discomfort.  Feeling short of breath.  Suddenly feeling light-headed or weak.  Getting tired easily during activity.  Fainting.  Sweating. In some cases, there are no symptoms. How is this treated? Treatment for this condition depends on underlying conditions and how you feel when you have atrial fibrillation. They include:  Medicines to: ? Prevent blood clots. ? Treat heart rate or heart rhythm problems.  Using devices, such as a pacemaker, to correct heart rhythm problems.  Doing surgery to remove the part of the heart that sends bad signals.  Closing an area where clots can form in the heart (left atrial appendage). In some cases, your doctor will treat other underlying  conditions. Follow these instructions at home: Medicines  Take over-the-counter and prescription medicines only as told by your doctor.  Do not take any new medicines without first talking to your doctor.  If you are taking blood thinners: ? Talk with your doctor before you take any medicines that have aspirin or NSAIDs, such as ibuprofen, in them. ? Take your medicine exactly as told by your doctor. Take it at the same time each day. ? Avoid activities that could hurt or bruise you. Follow instructions about how to prevent falls. ? Wear a bracelet that says you are taking blood thinners. Or, carry a card that lists what medicines you take. Lifestyle  Do not use any products that have nicotine or tobacco in them. These include cigarettes, e-cigarettes, and chewing tobacco. If you need help quitting, ask your doctor.  Eat heart-healthy foods. Talk with your doctor about the right eating plan for you.  Exercise regularly as told by your doctor.  Do not drink alcohol.  Lose weight if you are overweight.  Do not use drugs, including cannabis.      General instructions  If you have a condition that causes breathing to stop for a short period of time (apnea), treat it as told by your doctor.  Keep a healthy weight. Do not use diet pills unless your doctor says they are safe for you. Diet pills may make heart problems worse.  Keep all follow-up visits as told by your doctor. This is important. Contact a doctor if:  You notice a   change in the speed, rhythm, or strength of your heartbeat.  You are taking a blood-thinning medicine and you get more bruising.  You get tired more easily when you move or exercise.  You have a sudden change in weight. Get help right away if:  You have pain in your chest or your belly (abdomen).  You have trouble breathing.  You have side effects of blood thinners, such as blood in your vomit, poop (stool), or pee (urine), or bleeding that cannot  stop.  You have any signs of a stroke. "BE FAST" is an easy way to remember the main warning signs: ? B - Balance. Signs are dizziness, sudden trouble walking, or loss of balance. ? E - Eyes. Signs are trouble seeing or a change in how you see. ? F - Face. Signs are sudden weakness or loss of feeling in the face, or the face or eyelid drooping on one side. ? A - Arms. Signs are weakness or loss of feeling in an arm. This happens suddenly and usually on one side of the body. ? S - Speech. Signs are sudden trouble speaking, slurred speech, or trouble understanding what people say. ? T - Time. Time to call emergency services. Write down what time symptoms started.  You have other signs of a stroke, such as: ? A sudden, very bad headache with no known cause. ? Feeling like you may vomit (nausea). ? Vomiting. ? A seizure. These symptoms may be an emergency. Do not wait to see if the symptoms will go away. Get medical help right away. Call your local emergency services (911 in the [place] Do not drive yourself to the hospital.   Summary  Atrial fibrillation is a type of heartbeat that is irregular or fast.  You are at higher risk of this condition if you smoke, are older, have diabetes, or are overweight.  Follow your doctor's instructions about medicines, diet, exercise, and follow-up visits.  Get help right away if you have signs or symptoms of a stroke.  Get help right away if you cannot catch your breath, or you have chest pain or discomfort. This information is not intended to replace advice given to you by your health care provider. Make sure you discuss any questions you have with your health care provider. Document Revised: 10/25/2018 Document Reviewed: 10/25/2018 Elsevier Patient Education  2021 [employer] Inc[employer]   Hypertension, Adult Hypertension is another name for high blood pressure. High blood pressure forces your heart to work harder to pump blood. This can cause problems over  time. There are two numbers in a blood pressure reading. There is a top number (systolic) over a bottom number (diastolic). It is best to have a blood pressure that is below 120/80. Healthy choices can help lower your blood pressure, or you may need medicine to help lower it. What are the causes? The cause of this condition is not known. Some conditions may be related to high blood pressure. What increases the risk?  Smoking.  Having type 2 diabetes mellitus, high cholesterol, or both.  Not getting enough exercise or physical activity.  Being overweight.  Having too much fat, sugar, calories, or salt (sodium) in your diet.  Drinking too much alcohol.  Having long-term (chronic) kidney disease.  Having a family history of high blood pressure.  Age. Risk increases with age.  Race. You may be at higher risk if you are African American.  Gender. Men are at higher risk than women before age 68.  After age 71, women are at higher risk than men.  Having obstructive sleep apnea.  Stress. What are the signs or symptoms?  High blood pressure may not cause symptoms. Very high blood pressure (hypertensive crisis) may cause: ? Headache. ? Feelings of worry or nervousness (anxiety). ? Shortness of breath. ? Nosebleed. ? A feeling of being sick to your stomach (nausea). ? Throwing up (vomiting). ? Changes in how you see. ? Very bad chest pain. ? Seizures. How is this treated?  This condition is treated by making healthy lifestyle changes, such as: ? Eating healthy foods. ? Exercising more. ? Drinking less alcohol.  Your health care provider may prescribe medicine if lifestyle changes are not enough to get your blood pressure under control, and if: ? Your top number is above 130. ? Your bottom number is above 80.  Your personal target blood pressure may vary. Follow these instructions at home: Eating and drinking  If told, follow the DASH eating plan. To follow this  plan: ? Fill one half of your plate at each meal with fruits and vegetables. ? Fill one fourth of your plate at each meal with whole grains. Whole grains include whole-wheat pasta, brown rice, and whole-grain bread. ? Eat or drink low-fat dairy products, such as skim milk or low-fat yogurt. ? Fill one fourth of your plate at each meal with low-fat (lean) proteins. Low-fat proteins include fish, chicken without skin, eggs, beans, and tofu. ? Avoid fatty meat, cured and processed meat, or chicken with skin. ? Avoid pre-made or processed food.  Eat less than 1,500 mg of salt each day.  Do not drink alcohol if: ? Your doctor tells you not to drink. ? You are pregnant, may be pregnant, or are planning to become pregnant.  If you drink alcohol: ? Limit how much you use to:  0-1 drink a day for women.  0-2 drinks a day for men. ? Be aware of how much alcohol is in your drink. In the [place] one drink equals one 12 oz bottle of beer (355 mL), one 5 oz glass of wine (148 mL), or one 1 oz glass of hard liquor (44 mL).   Lifestyle  Work with your doctor to stay at a healthy weight or to lose weight. Ask your doctor what the best weight is for you.  Get at least 30 minutes of exercise most days of the week. This may include walking, swimming, or biking.  Get at least 30 minutes of exercise that strengthens your muscles (resistance exercise) at least 3 days a week. This may include lifting weights or doing Pilates.  Do not use any products that contain nicotine or tobacco, such as cigarettes, e-cigarettes, and chewing tobacco. If you need help quitting, ask your doctor.  Check your blood pressure at home as told by your doctor.  Keep all follow-up visits as told by your doctor. This is important.   Medicines  Take over-the-counter and prescription medicines only as told by your doctor. Follow directions carefully.  Do not skip doses of blood pressure medicine. The medicine does not work as  well if you skip doses. Skipping doses also puts you at risk for problems.  Ask your doctor about side effects or reactions to medicines that you should watch for. Contact a doctor if you:  Think you are having a reaction to the medicine you are taking.  Have headaches that keep coming back (recurring).  Feel dizzy.  Have swelling in your ankles.  Have trouble with your vision. Get help right away if you:  Get a very bad headache.  Start to feel mixed up (confused).  Feel weak or numb.  Feel faint.  Have very bad pain in your: ? Chest. ? Belly (abdomen).  Throw up more than once.  Have trouble breathing. Summary  Hypertension is another name for high blood pressure.  High blood pressure forces your heart to work harder to pump blood.  For most people, a normal blood pressure is less than 120/80.  Making healthy choices can help lower blood pressure. If your blood pressure does not get lower with healthy choices, you may need to take medicine. This information is not intended to replace advice given to you by your health care provider. Make sure you discuss any questions you have with your health care provider. Document Revised: 01/11/2018 Document Reviewed: 01/11/2018 Elsevier Patient Education  2021 [employer].

## 2020-08-22 NOTE — Consult Note (Signed)
Cardiology Consult    Patient ID: [name] MRN: [id number], DOB/AGE: [birth year]   Admit date: 08/21/2020 Date of Consult: 08/22/2020  Primary Physician: [facility] Primary Cardiologist: Lorine Bears, MD Requesting Provider: Noralee Stain, MD  Patient Profile    Joyce Perkins is a 45 y.o. female with a history of HTN, morbid obesity, OSA on bipap, anxiety, gestational diabetes, and PCOS, who is being seen today for the evaluation of atrial fibrillation with RVR/hypertensive urgency at the request of Dr. Noralee Stain.  Past Medical History   Past Medical History:  Diagnosis Date  . Anxiety   . Gestational diabetes   . Hypertension   . Obesity   . PCOS (polycystic ovarian syndrome)   . Persistent atrial fibrillation (HCC)    a. Dx 08/2020.  CHA2DS2VASc = 2.  . Preeclampsia    a. w/ prior pregnancies.  . Sleep apnea     Past Surgical History:  Procedure Laterality Date  . CESAREAN SECTION     x 2  . OVARIAN CYST REMOVAL Left   . SALPINGOOPHORECTOMY Left     Allergies  No Known Allergies  History of Present Illness    Joyce Perkins is a 45 y/o female with history of HTN, morbid obesity, OSA, anxiety, and PCOS, who is being seen today for the evaluation of atrial fibrillation with RVR/hypertensive urgency at the request of Dr. Noralee Stain. She has no prior cardiac history.  About 3 wks ago, she did not feel well, had a headache, checked her BP which was elevated, and pulse was 140 bpm. Denies palpitations or chest pressure at that time. She rested for two hours, felt better, and BP and pulse returned to normal. She says that she subsequently went to [place] and walked quite a bit w/o issue.  She had no further symptoms until 4 days ago when she began to experience intermittent shortness of breath and fatigue.  On the morning of 4/7, she got up to go to the bathroom and felt intense chest pressure that she describes as "an elephant pushing back on her  chest when [she] attempted to take a breath." She felt her heart racing into her back and it was causing her body to jerk. This resolved after approximately 1 minute and she got up to walk to the kitchen (not a long distance). When she got to the sink, she felt so exhausted that she had to lean onto the counter. The symptoms mostly resolved within a minute and she drove herself to urgent care. At urgent care, she was found to have atrial fibrillation with RVR and hypertensive urgency. She was transferred by EMS to [facility] ED. Her BP was very elevated at time of admission @ 260/98 and she reported that she had been off her anti-hypertensives since December. No prior hx of a fib. In the ED, BNP was elevated @ 483. HsTrop was mildly elevated w/ flat trend @ 50  49.  TSH nl.  D dimer 1.11  CTA this AM w/o PE.  She was admitted and placed on IV dilt with improved BP and rates.  Today, she is resting in bed and reports that she is feeling better. She is anxious to go home. She states she has felt back to baseline since approximately 1 hour after arrival yesterday. She denies dyspnea, chest pressure, lightheadedness, dizziness, syncope, or palpitations. She admits that she stopped taking her anti-hypertensive medication in Dec 2021.  She is wearing her home bipap for  OSA which she reports she wears when sleeping and napping. She has not seen anyone for sleep medicine in many years. She denies recent s/s of infection including chest/nasal congestion, sore throat, cough, fever, dysuria, or urinary frequency. Took a car trip to [facility] last week but made frequent stops. She has a chronically elevated WBC of 14-17,000 for which she has been evaluated by hematology.   Inpatient Medications    . apixaban  5 mg Oral BID  . diltiazem  60 mg Oral Q6H  . lisinopril  20 mg Oral Daily  . multivitamin with minerals  1 tablet Oral Daily    Family History    Family History  Problem Relation Age of Onset  . Hypertension  Mother   . COPD Mother   . Diabetes Mother   . Heart disease Mother   . Other Father        unknown medical history   She indicated that her mother is alive. She indicated that her father is alive.   Social History    Social History   Socioeconomic History  . Marital status: Married    Spouse name: Not on file  . Number of children: Not on file  . Years of education: Not on file  . Highest education level: Not on file  Occupational History  . Not on file  Tobacco Use  . Smoking status: Former Smoker    Quit date: 08/21/2000    Years since quitting: 20.0  . Smokeless tobacco: Never Used  Vaping Use  . Vaping Use: Never used  Substance and Sexual Activity  . Alcohol use: Not Currently  . Drug use: Not Currently  . Sexual activity: Not on file  Other Topics Concern  . Not on file  Social History Narrative  . Not on file   Social Determinants of Health   Financial Resource Strain: Not on file  Food Insecurity: Not on file  Transportation Needs: Not on file  Physical Activity: Not on file  Stress: Not on file  Social Connections: Not on file  Intimate Partner Violence: Not on file     Review of Systems    General:  No chills, fever, night sweats or weight changes.  Cardiovascular:  +++ chest pressure on admission, +++ dyspnea on exertion, +++ palpitations. Denies dizziness, lightheadedness, edema, PND, orthopnea.  Dermatological: No rash, lesions/masses Respiratory: No cough, +++ dyspnea on admission. Urologic: No hematuria, dysuria Abdominal:   No nausea, vomiting, diarrhea, bright red blood per rectum, melena, or hematemesis Neurologic:  No visual changes, wkns, changes in mental status. All other systems reviewed and are otherwise negative except as noted above.  Physical Exam    Blood pressure (!) 150/98, pulse 64, temperature 97.8 F (36.6 C), resp. rate 17, height 5\' 4"  (1.626 m), weight (!) 142.5 kg, last menstrual period 07/21/2020, SpO2 97 %.   General: Pleasant, NAD Psych: Normal affect. Neuro: Alert and oriented X 3. Moves all extremities spontaneously. HEENT: Normal  Neck: Supple without bruits. Difficult to assess for JVD due to girth. Lungs:  Resp regular and unlabored, CTA. Heart: IRR no s3, s4, or murmurs. Abdomen: Obese, soft, non-tender, non-distended, BS + x 4.  Extremities: No clubbing, cyanosis. Bilateral 1+ pitting edema lower extremities. DP/PT2+, Radials 2+ and equal bilaterally.  Labs    Cardiac Enzymes Recent Labs  Lab 08/21/20 1319 08/21/20 1517  TROPONINIHS 50* 49*      Lab Results  Component Value Date   WBC 15.8 (H) 08/22/2020  HGB 12.6 08/22/2020   HCT 39.5 08/22/2020   MCV 82.6 08/22/2020   PLT 401 (H) 08/22/2020    Recent Labs  Lab 08/22/20 0436  NA 138  K 3.2*  CL 102  CO2 25  BUN 20  CREATININE 0.90  CALCIUM 8.6*  GLUCOSE 130*    Lab Results  Component Value Date   DDIMER 1.11 (H) 08/21/2020     Radiology Studies    DG Chest 2 View  Result Date: 08/21/2020 CLINICAL DATA:  Chest pressure.  New onset atrial fibrillation. EXAM: CHEST - 2 VIEW COMPARISON:  Non FINDINGS: Mild enlargement of the cardiopericardial silhouette with indistinct pulmonary vasculature suggesting pulmonary venous hypertension. No Kerley B lines are identified. Subtle thickening of the fissures on the lateral projection. No blunting of the costophrenic angles. Lower thoracic spondylosis. IMPRESSION: 1. Mild enlargement of the cardiopericardial silhouette with pulmonary venous hypertension but no overt edema at this time. No pleural effusion. Electronically Signed   By: Gaylyn RongWalter  Liebkemann M.D.   On: 08/21/2020 14:29   CT ANGIO CHEST PE W OR WO CONTRAST  Result Date: 08/22/2020 CLINICAL DATA:  Positive D-dimer, shortness of breath, tachycardia, hypertension EXAM: CT ANGIOGRAPHY CHEST WITH CONTRAST TECHNIQUE: Multidetector CT imaging of the chest was performed using the standard protocol during bolus  administration of intravenous contrast. Multiplanar CT image reconstructions and MIPs were obtained to evaluate the vascular anatomy. CONTRAST:  75mL OMNIPAQUE IOHEXOL 350 MG/ML SOLN COMPARISON:  08/21/2020 chest x-ray FINDINGS: Cardiovascular: Pulmonary arteries are normal in caliber and patent. No significant filling defect or pulmonary embolus by CTA. Intact thoracic aorta. Patent 3 vessel arch anatomy. No mediastinal hemorrhage, hematoma, aneurysm or dissection. Normal heart size. Native coronary atherosclerosis. No pericardial effusion. Mediastinum/Nodes: Mildly prominent mediastinal, hilar and subcarinal lymph nodes. Subcarinal lymph nodes have calcification suggesting remote granulomatous disease. Subcarinal lymph nodes measure up to 1.9 cm in short axis, image 41 series 4. Mediastinal lymph nodes appear to have preserved fatty hila. Thyroid, trachea and esophagus within normal limits. No hiatal hernia. Trachea central airways are patent. Esophagus nondilated. Lungs/Pleura: Lungs are clear. No pleural effusion or pneumothorax. Upper Abdomen: Hepatic steatosis of the liver. Calcified granulomata throughout the spleen. Enlarged gastrohepatic lymph node with a short axis measurement of 2.6 cm, image 89 series 4. Enlarged anterior retroperitoneal pericaval node measuring 1.7 cm in short axis, image 101 series 4. No acute upper abdominal finding. Musculoskeletal: Degenerative changes noted spine. No compression fracture. Sternum intact. Review of the MIP images confirms the above findings. IMPRESSION: Negative for significant acute pulmonary embolus by CTA. No other acute intrathoracic  finding. Mild mediastinal, hilar and subcarinal adenopathy, nonspecific. Upper abdominal gastrohepatic ligament and pericaval adenopathy as well, only partially imaged. Lymphoproliferative process is a consideration. Consider further imaging of the abdomen and pelvis non emergently. Hepatic steatosis Electronically Signed   By: Judie PetitM.   Shick M.D.   On: 08/22/2020 11:53    ECG & Cardiac Imaging    2D Echocardiogram 08/22/20  IMPRESSIONS  1. Left ventricular ejection fraction, by estimation, is 55 to 60%. The  left ventricle has normal function. Left ventricular endocardial border  not optimally defined to evaluate regional wall motion. Left ventricular  diastolic parameters are  indeterminate.  2. Right ventricular systolic function is normal. The right ventricular  size is normal. Tricuspid regurgitation signal is inadequate for assessing  PA pressure.  3. Left atrial size was mildly dilated.  4. The mitral valve is normal in structure. No evidence of mitral valve  regurgitation. No evidence of mitral stenosis.  5. The aortic valve is normal in structure. Aortic valve regurgitation is  not visualized. No aortic stenosis is present.  6. The inferior vena cava is dilated in size with >50% respiratory  variability, suggesting right atrial pressure of 8 mmHg.    ECG 08/21/20 a fib with RVR at 149 bpm with LVH, inf/anterolateral ST depression, TWI V4-6 - personally reviewed.  Telemetry - a fib @ 60's bpm - personally reviewed  Assessment & Plan    1. A Fib with RVR: No previous hx of a fib but may have been in a fib up to 3 weeks; she reports an episode 3 weeks ago of feeling heart racing and heart rate was 140 bpm at home. Presented to UC on 4/7 w/ palps, dyspnea, fatigue, and back pain, and was found to be in AF w/ RVR  ED.  Treated w/ IV dilt w/ improved rates overnight.  Telemetry shows a fib with controlled rate in the 60s - now transitioned to po dilt at 60 mg q 6 hours. Evaluate for potential contributors including PE, infection, thyrotoxicosis. TSH on 4/8 3.6. d-dimer elevated at 1.11, CTA is neg for PE. CBC shows elevated WBC 17.9 15.8 but pt reports this is a chronic elevation that has been evaluated by hem/onc. Respiratory panel/CXR w/o evidence of infection.  Echo shows LVEF 55-60%, so we will transition her  from Lovenox to Eliquis, however cost may be an issue in the future (CHA2DS2-VASc Score = 2). She is anxious to go home. Discussed treatment options including TEE/DCCV, antiarrhythmics, rate control, and advised that we would like to observe heart rate/rhythm for the next several hours at least as she is now off IV dilt. She does not have health insurance so this will likely be a factor moving forward. Provided that rates and symptoms stable w/ ambulation on PO dilt, she can likely be d/c'd later this afternoon on consolidated dilt (likely CD 240 daily) and eliquis (I've asked case mgr to see) w/ plan for early outpt f/u and DCCV in 3 wks if she remains in Afib.  Pt agreeable w/ that plan. Lack of insurance may complicate things going forward.      :[id number]}  2. Demand ischemia/Elevated troponin: HsTroponin 50  49 likely in the setting of a fib with RVR. She is pain free now and reports she has felt much better since approximately 1 hour after her arrival in the ED on 4/7. Prior to arrival, she had chest pressure that she describes as "an elephant pushing on my chest when I attempt to take a deep breath" but this symptom was limited to occasions when she also felt her heart racing. She has been active at home and walking long distance recently without s/s of dyspnea, chest discomfort. Native coronary atherosclerosis noted on CTA chest this AM. Echo w/ nl EF.  Will continue to monitor in the setting of rate control. Ideally, would pursue outpt noninvasive ischemic eval for risk stratification, esp if AAD needed in the future, however body habitus makes this very challenging.     3. Hypertensive urgency/Essential hypertension: BP this morning 129/94. BP was very elevated at time of admission and likely elevated prior to admission. She was diagnosed with preeclampsia with both pregnancies (now teenagers) and was told she had hypertension in Oct 2021. Admits to stopping her anti-hypertensive medication in December  2021. Currently on ACEi and diltiazem for rate control. Will continue to monitor BP and can make  adjustments for home therapy based on HR/rhythm control.    4. Hypokalemia: K+ 3.2. On oral potassium chloride for replenishment. Recheck bmet tomorrow if still here.  5. Morbid Obesity: We discussed the correlation between obesity, sleep apnea, and a fib. She is motivated to lose weight (successfully lost 65 lbs in the past but gained it back).  Encouraged her to set attainable weight loss/dietary goals for herself and reach out to PCP for assistance.   6.  OSA:  She is compliant with her bipap but has not been seen by sleep medicine in many years. She monitors the status of the reports from her equipment but it has not been serviced since 2013. She is aware she should reestablish with them.   Signed, Nicolasa Ducking, NP 08/22/2020, 1:43 PM  For questions or updates, please contact   Please consult [email or web address] for contact info under Cardiology/STEMI.

## 2020-08-22 NOTE — Progress Notes (Signed)
PROGRESS NOTE    Joyce Perkins  WUJ:[id number] DOB: 26-Mar-1976 DOA: 08/21/2020 PCP: [facility]     Brief Narrative:  Joyce Perkins is a 45 y.o. female with medical history significant for history of HELLP syndrome, history of chronic hypertension with superimposed pre-eclampsia, PCOS, history of 2 cesarean section deliveries (2006 and 2014), postpartum depression, morbid obesity with BMI greater than 50, hypertension, history of chronic persistent leukocytosis post delivery, history of NIDDM and gestational DM, history of tobacco use quit when she was 45 years old, presents to the emergency department for chief concerns of shortness of breath.  She reported shortness of breath that has been ongoing for several days, worsening with exertion.  She had no complaints of chest pain.  She states that earlier last week, she had an episode where she was not feeling very well, blood pressure cuff revealed elevated blood pressure as well as elevated heart rate in the 140s.  This eventually subsided and patient went about her day.  She has not been taking her blood pressure medication since she ran out in December 2021.  In the emergency department, patient was found to have A. fib RVR with heart rate in the 140s, started on Cardizem drip.  She was also found to have elevated blood pressure up to 225/145.  New events last 24 hours / Subjective: This morning, she states that she is feeling much better.  Patient is back in normal sinus rhythm, blood pressure much improved 129/94.  She has no symptoms this morning.  Wants to go home.  We discussed lifestyle changes, medication compliance.  She states that this was a wake-up call, had a cousin who was 104 years old who died suddenly from a blood clot.  Assessment & Plan:   Principal Problem:   Atrial fibrillation with RVR (HCC) Active Problems:   Hypertensive urgency   Essential hypertension   Leukocytosis   Obesity, morbid, BMI 50 or higher  (HCC)   At risk for extreme obesity with alveolar hypoventilation   PCOS (polycystic ovarian syndrome)   A. fib RVR -Now in normal sinus rhythm.  Discontinue Cardizem drip and transition to oral -CHA2DS2-VASc score 2, we discussed risk of stroke with A Fib  -Echocardiogram pending -Suspect patient has had paroxysmal episodes at least in the last week -Cardiology consulted  Hypertensive urgency -Due to medication noncompliance -Blood pressure improved this morning -Continue lisinopril, cardizem   Elevated D-dimer -CTA chest to rule out PE  Demand ischemia -Troponin elevated 50 --> 49 -Echocardiogram pending  Chronic leukocytosis -Procalcitonin is negative, no signs of infectious process at this point -UA negative  Hypokalemia -Replace, trend  DVT prophylaxis:  Place TED hose Start: 08/21/20 1454  Code Status: Full code Family Communication: No family at bedside Disposition Plan:  Status is: Observation  The patient will require care spanning > 2 midnights and should be moved to inpatient because: Ongoing diagnostic testing needed not appropriate for outpatient work up and Inpatient level of care appropriate due to severity of illness  Dispo: The patient is from: Home              Anticipated d/c is to: Home              Patient currently is not medically stable to d/c.  CTA chest, echocardiogram pending.  Cardiology consulted today   Difficult to place patient No      Consultants:   Cardiology  Procedures:   None  Antimicrobials:  Anti-infectives (From admission, onward)   None        Objective: Vitals:   08/21/20 2306 08/22/20 0423 08/22/20 0652 08/22/20 0743  BP: (!) 159/83 (!) 103/57 112/81 (!) 129/94  Pulse: (!) 105 65 (!) 52 66  Resp: 18 18  18   Temp: 98.4 F (36.9 C) 97.8 F (36.6 C)  98.1 F (36.7 C)  TempSrc: Oral   Oral  SpO2: 97% 97% 98% 98%  Weight:  (!) 142.5 kg    Height:        Intake/Output Summary (Last 24 hours) at  08/22/2020 1023 Last data filed at 08/22/2020 1004 Gross per 24 hour  Intake 360 ml  Output --  Net 360 ml   Filed Weights   08/21/20 1315 08/22/20 0423  Weight: (!) 142.9 kg (!) 142.5 kg    Examination:  General exam: Appears calm and comfortable, obese Respiratory system: Clear to auscultation. Respiratory effort normal. No respiratory distress. No conversational dyspnea.  Cardiovascular system: S1 & S2 heard, RRR. No murmurs. No pedal edema. Gastrointestinal system: Abdomen is nondistended, soft and nontender. Normal bowel sounds heard. Central nervous system: Alert and oriented. No focal neurological deficits. Speech clear.  Extremities: Symmetric in appearance  Skin: No rashes, lesions or ulcers on exposed skin  Psychiatry: Judgement and insight appear normal. Mood & affect appropriate.  Tearful  Data Reviewed: I have personally reviewed following labs and imaging studies  CBC: Recent Labs  Lab 08/21/20 1319 08/22/20 0436  WBC 17.9* 15.8*  HGB 14.2 12.6  HCT 44.1 39.5  MCV 82.0 82.6  PLT 443* 401*   Basic Metabolic Panel: Recent Labs  Lab 08/21/20 1319 08/22/20 0436  NA 139 138  K 3.7 3.2*  CL 104 102  CO2 23 25  GLUCOSE 123* 130*  BUN 15 20  CREATININE 0.90 0.90  CALCIUM 8.9 8.6*   GFR: Estimated Creatinine Clearance: 113.1 mL/min (by C-G formula based on SCr of 0.9 mg/dL). Liver Function Tests: No results for input(s): AST, ALT, ALKPHOS, BILITOT, PROT, ALBUMIN in the last 168 hours. No results for input(s): LIPASE, AMYLASE in the last 168 hours. No results for input(s): AMMONIA in the last 168 hours. Coagulation Profile: No results for input(s): INR, PROTIME in the last 168 hours. Cardiac Enzymes: No results for input(s): CKTOTAL, CKMB, CKMBINDEX, TROPONINI in the last 168 hours. BNP (last 3 results) No results for input(s): PROBNP in the last 8760 hours. HbA1C: No results for input(s): HGBA1C in the last 72 hours. CBG: No results for input(s):  GLUCAP in the last 168 hours. Lipid Profile: No results for input(s): CHOL, HDL, LDLCALC, TRIG, CHOLHDL, LDLDIRECT in the last 72 hours. Thyroid Function Tests: Recent Labs    08/21/20 1517  TSH 3.609  FREET4 1.12   Anemia Panel: No results for input(s): VITAMINB12, FOLATE, FERRITIN, TIBC, IRON, RETICCTPCT in the last 72 hours. Sepsis Labs: Recent Labs  Lab 08/21/20 1517  PROCALCITON <0.10    Recent Results (from the past 240 hour(s))  Resp Panel by RT-PCR (Flu A&B, Covid) Nasopharyngeal Swab     Status: None   Collection Time: 08/21/20  2:42 PM   Specimen: Nasopharyngeal Swab; Nasopharyngeal(NP) swabs in vial transport medium  Result Value Ref Range Status   SARS Coronavirus 2 by RT PCR NEGATIVE NEGATIVE Final    Comment: (NOTE) SARS-CoV-2 target nucleic acids are NOT DETECTED.  The SARS-CoV-2 RNA is generally detectable in upper respiratory specimens during the acute phase of infection. The lowest concentration of SARS-CoV-2 viral  copies this assay can detect is 138 copies/mL. A negative result does not preclude SARS-Cov-2 infection and should not be used as the sole basis for treatment or other patient management decisions. A negative result may occur with  improper specimen collection/handling, submission of specimen other than nasopharyngeal swab, presence of viral mutation(s) within the areas targeted by this assay, and inadequate number of viral copies(<138 copies/mL). A negative result must be combined with clinical observations, patient history, and epidemiological information. The expected result is Negative.  Fact Sheet for Patients:  BloggerCourse.com  Fact Sheet for Healthcare Providers:  SeriousBroker.it  This test is no t yet approved or cleared by the [place] FDA and  has been authorized for detection and/or diagnosis of SARS-CoV-2 by FDA under an Emergency Use Authorization (EUA). This EUA will  remain  in effect (meaning this test can be used) for the duration of the COVID-19 declaration under Section 564(b)(1) of the Act, 21 U.S.C.section 360bbb-3(b)(1), unless the authorization is terminated  or revoked sooner.       Influenza A by PCR NEGATIVE NEGATIVE Final   Influenza B by PCR NEGATIVE NEGATIVE Final    Comment: (NOTE) The Xpert Xpress SARS-CoV-2/FLU/RSV plus assay is intended as an aid in the diagnosis of influenza from Nasopharyngeal swab specimens and should not be used as a sole basis for treatment. Nasal washings and aspirates are unacceptable for Xpert Xpress SARS-CoV-2/FLU/RSV testing.  Fact Sheet for Patients: BloggerCourse.com  Fact Sheet for Healthcare Providers: SeriousBroker.it  This test is not yet approved or cleared by the [place] FDA and has been authorized for detection and/or diagnosis of SARS-CoV-2 by FDA under an Emergency Use Authorization (EUA). This EUA will remain in effect (meaning this test can be used) for the duration of the COVID-19 declaration under Section 564(b)(1) of the Act, 21 U.S.C. section 360bbb-3(b)(1), unless the authorization is terminated or revoked.  Performed at [facility], [address], [place], [place] [address]       Radiology Studies: DG Chest 2 View  Result Date: 08/21/2020 CLINICAL DATA:  Chest pressure.  New onset atrial fibrillation. EXAM: CHEST - 2 VIEW COMPARISON:  Non FINDINGS: Mild enlargement of the cardiopericardial silhouette with indistinct pulmonary vasculature suggesting pulmonary venous hypertension. No Kerley B lines are identified. Subtle thickening of the fissures on the lateral projection. No blunting of the costophrenic angles. Lower thoracic spondylosis. IMPRESSION: 1. Mild enlargement of the cardiopericardial silhouette with pulmonary venous hypertension but no overt edema at this time. No pleural effusion. Electronically  Signed   By: Gaylyn Rong M.D.   On: 08/21/2020 14:29      Scheduled Meds: . diltiazem  60 mg Oral Q6H  . enoxaparin (LOVENOX) injection  0.5 mg/kg Subcutaneous Q24H  . lisinopril  20 mg Oral Daily  . multivitamin with minerals  1 tablet Oral Daily  . potassium chloride  40 mEq Oral Q4H   Continuous Infusions:   LOS: 0 days      Time spent: 30 minutes   Noralee Stain, DO [facility] 08/22/2020, 10:23 AM   Available via Epic secure chat 7am-7pm After these hours, please refer to coverage provider listed on [email or web address]

## 2020-08-22 NOTE — Progress Notes (Signed)
Patient HR 80-100s afib. Patient requested PIV to be removed. Currently does not have PIV; patient is anticipating going home. Visibly upset and tearful regarding replacing PIV, cardiology aware and agreeable as long as patient remains stable.

## 2020-08-22 NOTE — Progress Notes (Signed)
*  PRELIMINARY RESULTS* Echocardiogram 2D Echocardiogram has been performed.  Joyce Perkins 08/22/2020, 9:31 AM

## 2020-08-22 NOTE — Discharge Summary (Signed)
Physician Discharge Summary  Joyce Perkins ZOX:[id number] DOB: 07-17-1975 DOA: 08/21/2020  PCP[facility]  Admit date: 08/21/2020 Discharge date: 08/22/2020  Admitted From: Home Disposition:  Home  Recommendations for Outpatient Follow-up:  1. Follow up with PCP in 1 week 2. Follow up with Cardiology   Discharge Condition: Stable CODE STATUS: Full  Diet recommendation: Heart healthy   Brief/Interim Summary: Joyce Perkins is a 45 y.o.femalewith medical history significant forhistory of HELLP syndrome,history of chronic hypertension with superimposed pre-eclampsia,PCOS, history of 2 cesarean section deliveries(2006 and 2014), postpartum depression, morbid obesity with BMI greater than 50, hypertension, history of chronic persistent leukocytosis post delivery,history of NIDDM and gestational DM,history of tobacco use quit when she was 45 years old, presents to the emergency department for chief concerns of shortness of breath.  She reported shortness of breath that has been ongoing for several days, worsening with exertion.  She had no complaints of chest pain.  She states that earlier last week, she had an episode where she was not feeling very well, blood pressure cuff revealed elevated blood pressure as well as elevated heart rate in the 140s.  This eventually subsided and patient went about her day.  She has not been taking her blood pressure medication since she ran out in December 2021.  In the emergency department, patient was found to have A. fib RVR with heart rate in the 140s, started on Cardizem drip.  She was also found to have elevated blood pressure up to 225/145.  Patient's heart rate as well as blood pressure trended downward.  On day of discharge, patient was feeling much better.  Cardiology was consulted and patient was transitioned to oral metoprolol, Eliquis and to follow-up closely with cardiology as outpatient.  Discharge Diagnoses:  Principal  Problem:   Atrial fibrillation with RVR (HCC) Active Problems:   Hypertensive urgency   Essential hypertension   Leukocytosis   Obesity, morbid, BMI 50 or higher (HCC)   At risk for extreme obesity with alveolar hypoventilation   PCOS (polycystic ovarian syndrome)   A. fib RVR -Discontinue Cardizem drip and transition to oral metoprolol -CHA2DS2-VASc score 2, we discussed risk of stroke with A Fib.  Started on Eliquis by cardiology -Echocardiogram showed EF 55 to 60% -Cardiology consulted, follow-up as outpatient  Hypertensive urgency -Due to medication noncompliance -Blood pressure improved this morning -Continue lisinopril, metoprolol   Elevated D-dimer -CTA chest negative for PE  Demand ischemia -Troponin elevated 50 --> 49 -Echocardiogram without wall motion abnormality  Chronic leukocytosis -Procalcitonin is negative, no signs of infectious process at this point -UA negative  Hypokalemia -Replace, trend   Discharge Instructions  Discharge Instructions    Call MD for:  difficulty breathing, headache or visual disturbances   Complete by: As directed    Call MD for:  extreme fatigue   Complete by: As directed    Call MD for:  persistant dizziness or light-headedness   Complete by: As directed    Call MD for:  persistant nausea and vomiting   Complete by: As directed    Call MD for:  severe uncontrolled pain   Complete by: As directed    Call MD for:  temperature >100.4   Complete by: As directed    Diet - low sodium heart healthy   Complete by: As directed    Discharge instructions   Complete by: As directed    You were cared for by a hospitalist during your hospital stay. If you  have any questions about your discharge medications or the care you received while you were in the hospital after you are discharged, you can call the unit and ask to speak with the hospitalist on call if the hospitalist that took care of you is not available. Once you are  discharged, your primary care physician will handle any further medical issues. Please note that NO REFILLS for any discharge medications will be authorized once you are discharged, as it is imperative that you return to your primary care physician (or establish a relationship with a primary care physician if you do not have one) for your aftercare needs so that they can reassess your need for medications and monitor your lab values.   Increase activity slowly   Complete by: As directed      Allergies as of 08/22/2020   No Known Allergies     Medication List    STOP taking these medications   ibuprofen 200 MG tablet Commonly known as: ADVIL   lisinopril-hydrochlorothiazide 10-12.5 MG tablet Commonly known as: ZESTORETIC   sertraline 50 MG tablet Commonly known as: ZOLOFT     TAKE these medications   apixaban 5 MG Tabs tablet Commonly known as: ELIQUIS Take 1 tablet (5 mg total) by mouth 2 (two) times daily.   BIOTIN PO Take 1 tablet by mouth daily.   lisinopril 20 MG tablet Commonly known as: ZESTRIL Take 1 tablet (20 mg total) by mouth daily. Start taking on: August 23, 2020   metoprolol tartrate 50 MG tablet Commonly known as: LOPRESSOR Take 1 tablet (50 mg total) by mouth 2 (two) times daily.   multivitamin tablet Take 1 tablet by mouth daily.   VITAMIN D PO Take 1 tablet by mouth daily.       Follow-up [employer] [employer] Schedule an appointment as soon as possible for a visit in 1 week(s).   Contact information: [address] [place] [address] [phone]        Iran Ouch, MD Follow up.   Specialty: Cardiology Contact information: [address] [place] [address] [phone]              No Known Allergies  Consultations:  Cardiology   Procedures/Studies: DG Chest 2 View  Result Date: 08/21/2020 CLINICAL DATA:  Chest pressure.  New onset atrial fibrillation. EXAM: CHEST - 2 VIEW  COMPARISON:  Non FINDINGS: Mild enlargement of the cardiopericardial silhouette with indistinct pulmonary vasculature suggesting pulmonary venous hypertension. No Kerley B lines are identified. Subtle thickening of the fissures on the lateral projection. No blunting of the costophrenic angles. Lower thoracic spondylosis. IMPRESSION: 1. Mild enlargement of the cardiopericardial silhouette with pulmonary venous hypertension but no overt edema at this time. No pleural effusion. Electronically Signed   By: Gaylyn Rong M.D.   On: 08/21/2020 14:29   CT ANGIO CHEST PE W OR WO CONTRAST  Result Date: 08/22/2020 CLINICAL DATA:  Positive D-dimer, shortness of breath, tachycardia, hypertension EXAM: CT ANGIOGRAPHY CHEST WITH CONTRAST TECHNIQUE: Multidetector CT imaging of the chest was performed using the standard protocol during bolus administration of intravenous contrast. Multiplanar CT image reconstructions and MIPs were obtained to evaluate the vascular anatomy. CONTRAST:  48mL OMNIPAQUE IOHEXOL 350 MG/ML SOLN COMPARISON:  08/21/2020 chest x-ray FINDINGS: Cardiovascular: Pulmonary arteries are normal in caliber and patent. No significant filling defect or pulmonary embolus by CTA. Intact thoracic aorta. Patent 3 vessel arch anatomy. No mediastinal hemorrhage, hematoma,  aneurysm or dissection. Normal heart size. Native coronary atherosclerosis. No pericardial effusion. Mediastinum/Nodes: Mildly prominent mediastinal, hilar and subcarinal lymph nodes. Subcarinal lymph nodes have calcification suggesting remote granulomatous disease. Subcarinal lymph nodes measure up to 1.9 cm in short axis, image 41 series 4. Mediastinal lymph nodes appear to have preserved fatty hila. Thyroid, trachea and esophagus within normal limits. No hiatal hernia. Trachea central airways are patent. Esophagus nondilated. Lungs/Pleura: Lungs are clear. No pleural effusion or pneumothorax. Upper Abdomen: Hepatic steatosis of the liver.  Calcified granulomata throughout the spleen. Enlarged gastrohepatic lymph node with a short axis measurement of 2.6 cm, image 89 series 4. Enlarged anterior retroperitoneal pericaval node measuring 1.7 cm in short axis, image 101 series 4. No acute upper abdominal finding. Musculoskeletal: Degenerative changes noted spine. No compression fracture. Sternum intact. Review of the MIP images confirms the above findings. IMPRESSION: Negative for significant acute pulmonary embolus by CTA. No other acute intrathoracic  finding. Mild mediastinal, hilar and subcarinal adenopathy, nonspecific. Upper abdominal gastrohepatic ligament and pericaval adenopathy as well, only partially imaged. Lymphoproliferative process is a consideration. Consider further imaging of the abdomen and pelvis non emergently. Hepatic steatosis Electronically Signed   By: Judie Petit.  Shick M.D.   On: 08/22/2020 11:53   ECHOCARDIOGRAM COMPLETE  Result Date: 08/22/2020    ECHOCARDIOGRAM REPORT   Patient Name:   JAALA BOHLE Kolk Date of Exam: 08/22/2020 Medical Rec #:  [id number]      Height:       64.0 in Accession #:    [id number]     Weight:       314.1 lb Date of Birth:  Aug 18, 1975      BSA:          2.370 m Patient Age:    44 years       BP:           129/94 mmHg Patient Gender: F              HR:           82 bpm. Exam Location:  [facility] Procedure: 2D Echo, Limited Color Doppler, Cardiac Doppler and Intracardiac            Opacification Agent Indications:     R06.00 Dyspnea  History:         Patient has no prior history of Echocardiogram examinations.                  Arrythmias:Atrial Fibrillation; Risk Factors:Sleep Apnea and                  Hypertension.  Sonographer:     Humphrey Rolls RDCS (AE) Referring Phys:  [id number] AMY N COX Diagnosing Phys: Lorine Bears MD  Sonographer Comments: Technically difficult study due to poor echo windows and no parasternal window. Image acquisition challenging due to patient body habitus. IMPRESSIONS  1. Left ventricular  ejection fraction, by estimation, is 55 to 60%. The left ventricle has normal function. Left ventricular endocardial border not optimally defined to evaluate regional wall motion. Left ventricular diastolic parameters are indeterminate.  2. Right ventricular systolic function is normal. The right ventricular size is normal. Tricuspid regurgitation signal is inadequate for assessing PA pressure.  3. Left atrial size was mildly dilated.  4. The mitral valve is normal in structure. No evidence of mitral valve regurgitation. No evidence of mitral stenosis.  5. The aortic valve is normal in structure. Aortic valve regurgitation is not visualized. No aortic stenosis is  present.  6. The inferior vena cava is dilated in size with >50% respiratory variability, suggesting right atrial pressure of 8 mmHg. FINDINGS  Left Ventricle: Left ventricular ejection fraction, by estimation, is 55 to 60%. The left ventricle has normal function. Left ventricular endocardial border not optimally defined to evaluate regional wall motion. Definity contrast agent was given IV to delineate the left ventricular endocardial borders. The left ventricular internal cavity size was normal in size. There is no left ventricular hypertrophy. Left ventricular diastolic parameters are indeterminate. Right Ventricle: The right ventricular size is normal. No increase in right ventricular wall thickness. Right ventricular systolic function is normal. Tricuspid regurgitation signal is inadequate for assessing PA pressure. Left Atrium: Left atrial size was mildly dilated. Right Atrium: Right atrial size was normal in size. Pericardium: There is no evidence of pericardial effusion. Mitral Valve: The mitral valve is normal in structure. No evidence of mitral valve regurgitation. No evidence of mitral valve stenosis. MV peak gradient, 8.5 mmHg. The mean mitral valve gradient is 4.0 mmHg. Tricuspid Valve: The tricuspid valve is normal in structure. Tricuspid  valve regurgitation is not demonstrated. No evidence of tricuspid stenosis. Aortic Valve: The aortic valve is normal in structure. Aortic valve regurgitation is not visualized. No aortic stenosis is present. Aortic valve mean gradient measures 8.0 mmHg. Aortic valve peak gradient measures 15.8 mmHg. Pulmonic Valve: The pulmonic valve was normal in structure. Pulmonic valve regurgitation is not visualized. No evidence of pulmonic stenosis. Aorta: The aortic root is normal in size and structure. Venous: The inferior vena cava is dilated in size with greater than 50% respiratory variability, suggesting right atrial pressure of 8 mmHg. IAS/Shunts: No atrial level shunt detected by color flow Doppler.   LV Volumes (MOD) LV vol d, MOD A2C: 102.0 ml Diastology LV vol d, MOD A4C: 90.5 ml  LV e' medial:    7.72 cm/s LV vol s, MOD A2C: 47.5 ml  LV E/e' medial:  16.6 LV vol s, MOD A4C: 45.1 ml  LV e' lateral:   7.29 cm/s LV SV MOD A2C:     54.5 ml  LV E/e' lateral: 17.6 LV SV MOD A4C:     90.5 ml LV SV MOD BP:      50.6 ml RIGHT VENTRICLE RV Basal diam:  4.40 cm LEFT ATRIUM             Index LA Vol (A2C):   54.3 ml 22.91 ml/m LA Vol (A4C):   63.2 ml 26.67 ml/m LA Biplane Vol: 58.5 ml 24.68 ml/m  AORTIC VALVE AV Vmax:           199.00 cm/s AV Vmean:          130.000 cm/s AV VTI:            0.347 m AV Peak Grad:      15.8 mmHg AV Mean Grad:      8.0 mmHg LVOT Vmax:         122.00 cm/s LVOT Vmean:        85.600 cm/s LVOT VTI:          0.222 m LVOT/AV VTI ratio: 0.64 MITRAL VALVE MV Area (PHT): 3.30 cm     SHUNTS MV Peak grad:  8.5 mmHg     Systemic VTI: 0.22 m MV Mean grad:  4.0 mmHg MV Vmax:       1.46 m/s MV Vmean:      91.7 cm/s MV Decel Time: 230 msec MV E velocity:  128.00 cm/s Lorine Bears MD Electronically signed by Lorine Bears MD Signature Date/Time: 08/22/2020/12:31:04 PM    Final        Discharge Exam: Vitals:   08/22/20 0743 08/22/20 1207  BP: (!) 129/94 (!) 150/98  Pulse: 66 64  Resp: 18 17  Temp:  98.1 F (36.7 C) 97.8 F (36.6 C)  SpO2: 98% 97%    General: Pt is alert, awake, not in acute distress Cardiovascular: S1/S2 +, no edema Respiratory: CTA bilaterally, no wheezing, no rhonchi, no respiratory distress, no conversational dyspnea  Abdominal: Soft, NT, ND, bowel sounds + Extremities: no edema, no cyanosis Psych: Normal mood and affect, stable judgement and insight     The results of significant diagnostics from this hospitalization (including imaging, microbiology, ancillary and laboratory) are listed below for reference.     Microbiology: Recent Results (from the past 240 hour(s))  Resp Panel by RT-PCR (Flu A&B, Covid) Nasopharyngeal Swab     Status: None   Collection Time: 08/21/20  2:42 PM   Specimen: Nasopharyngeal Swab; Nasopharyngeal(NP) swabs in vial transport medium  Result Value Ref Range Status   SARS Coronavirus 2 by RT PCR NEGATIVE NEGATIVE Final    Comment: (NOTE) SARS-CoV-2 target nucleic acids are NOT DETECTED.  The SARS-CoV-2 RNA is generally detectable in upper respiratory specimens during the acute phase of infection. The lowest concentration of SARS-CoV-2 viral copies this assay can detect is 138 copies/mL. A negative result does not preclude SARS-Cov-2 infection and should not be used as the sole basis for treatment or other patient management decisions. A negative result may occur with  improper specimen collection/handling, submission of specimen other than nasopharyngeal swab, presence of viral mutation(s) within the areas targeted by this assay, and inadequate number of viral copies(<138 copies/mL). A negative result must be combined with clinical observations, patient history, and epidemiological information. The expected result is Negative.  Fact Sheet for Patients:  BloggerCourse.com  Fact Sheet for Healthcare Providers:  SeriousBroker.it  This test is no t yet approved or cleared by  the [place] FDA and  has been authorized for detection and/or diagnosis of SARS-CoV-2 by FDA under an Emergency Use Authorization (EUA). This EUA will remain  in effect (meaning this test can be used) for the duration of the COVID-19 declaration under Section 564(b)(1) of the Act, 21 U.S.C.section 360bbb-3(b)(1), unless the authorization is terminated  or revoked sooner.       Influenza A by PCR NEGATIVE NEGATIVE Final   Influenza B by PCR NEGATIVE NEGATIVE Final    Comment: (NOTE) The Xpert Xpress SARS-CoV-2/FLU/RSV plus assay is intended as an aid in the diagnosis of influenza from Nasopharyngeal swab specimens and should not be used as a sole basis for treatment. Nasal washings and aspirates are unacceptable for Xpert Xpress SARS-CoV-2/FLU/RSV testing.  Fact Sheet for Patients: BloggerCourse.com  Fact Sheet for Healthcare Providers: SeriousBroker.it  This test is not yet approved or cleared by the [place] FDA and has been authorized for detection and/or diagnosis of SARS-CoV-2 by FDA under an Emergency Use Authorization (EUA). This EUA will remain in effect (meaning this test can be used) for the duration of the COVID-19 declaration under Section 564(b)(1) of the Act, 21 U.S.C. section 360bbb-3(b)(1), unless the authorization is terminated or revoked.  Performed at [facility], [address] [place], [place] [address]      Labs: BNP (last 3 results) Recent Labs    08/21/20 1517  BNP 483.7*   Basic Metabolic Panel:  Recent Labs  Lab 08/21/20 1319 08/22/20 0436  NA 139 138  K 3.7 3.2*  CL 104 102  CO2 23 25  GLUCOSE 123* 130*  BUN 15 20  CREATININE 0.90 0.90  CALCIUM 8.9 8.6*   Liver Function Tests: No results for input(s): AST, ALT, ALKPHOS, BILITOT, PROT, ALBUMIN in the last 168 hours. No results for input(s): LIPASE, AMYLASE in the last 168 hours. No results for input(s): AMMONIA  in the last 168 hours. CBC: Recent Labs  Lab 08/21/20 1319 08/22/20 0436  WBC 17.9* 15.8*  HGB 14.2 12.6  HCT 44.1 39.5  MCV 82.0 82.6  PLT 443* 401*   Cardiac Enzymes: No results for input(s): CKTOTAL, CKMB, CKMBINDEX, TROPONINI in the last 168 hours. BNP: Invalid input(s): POCBNP CBG: No results for input(s): GLUCAP in the last 168 hours. D-Dimer Recent Labs    08/21/20 1517  DDIMER 1.11*   Hgb A1c No results for input(s): HGBA1C in the last 72 hours. Lipid Profile No results for input(s): CHOL, HDL, LDLCALC, TRIG, CHOLHDL, LDLDIRECT in the last 72 hours. Thyroid function studies Recent Labs    08/21/20 1517  TSH 3.609   Anemia work up No results for input(s): VITAMINB12, FOLATE, FERRITIN, TIBC, IRON, RETICCTPCT in the last 72 hours. Urinalysis    Component Value Date/Time   COLORURINE STRAW (A) 08/21/2020 1411   APPEARANCEUR CLEAR (A) 08/21/2020 1411   LABSPEC 1.008 08/21/2020 1411   PHURINE 7.0 08/21/2020 1411   GLUCOSEU 50 (A) 08/21/2020 1411   HGBUR SMALL (A) 08/21/2020 1411   BILIRUBINUR NEGATIVE 08/21/2020 1411   KETONESUR NEGATIVE 08/21/2020 1411   PROTEINUR >=300 (A) 08/21/2020 1411   NITRITE NEGATIVE 08/21/2020 1411   LEUKOCYTESUR NEGATIVE 08/21/2020 1411   Sepsis Labs Invalid input(s): PROCALCITONIN,  WBC,  LACTICIDVEN Microbiology Recent Results (from the past 240 hour(s))  Resp Panel by RT-PCR (Flu A&B, Covid) Nasopharyngeal Swab     Status: None   Collection Time: 08/21/20  2:42 PM   Specimen: Nasopharyngeal Swab; Nasopharyngeal(NP) swabs in vial transport medium  Result Value Ref Range Status   SARS Coronavirus 2 by RT PCR NEGATIVE NEGATIVE Final    Comment: (NOTE) SARS-CoV-2 target nucleic acids are NOT DETECTED.  The SARS-CoV-2 RNA is generally detectable in upper respiratory specimens during the acute phase of infection. The lowest concentration of SARS-CoV-2 viral copies this assay can detect is 138 copies/mL. A negative result  does not preclude SARS-Cov-2 infection and should not be used as the sole basis for treatment or other patient management decisions. A negative result may occur with  improper specimen collection/handling, submission of specimen other than nasopharyngeal swab, presence of viral mutation(s) within the areas targeted by this assay, and inadequate number of viral copies(<138 copies/mL). A negative result must be combined with clinical observations, patient history, and epidemiological information. The expected result is Negative.  Fact Sheet for Patients:  BloggerCourse.comhttps://www.fda.gov/media/152166/download  Fact Sheet for Healthcare Providers:  SeriousBroker.ithttps://www.fda.gov/media/152162/download  This test is no t yet approved or cleared by the [place] FDA and  has been authorized for detection and/or diagnosis of SARS-CoV-2 by FDA under an Emergency Use Authorization (EUA). This EUA will remain  in effect (meaning this test can be used) for the duration of the COVID-19 declaration under Section 564(b)(1) of the Act, 21 U.S.C.section 360bbb-3(b)(1), unless the authorization is terminated  or revoked sooner.       Influenza A by PCR NEGATIVE NEGATIVE Final   Influenza B by PCR NEGATIVE NEGATIVE Final  Comment: (NOTE) The Xpert Xpress SARS-CoV-2/FLU/RSV plus assay is intended as an aid in the diagnosis of influenza from Nasopharyngeal swab specimens and should not be used as a sole basis for treatment. Nasal washings and aspirates are unacceptable for Xpert Xpress SARS-CoV-2/FLU/RSV testing.  Fact Sheet for Patients: BloggerCourse.com  Fact Sheet for Healthcare Providers: SeriousBroker.it  This test is not yet approved or cleared by the [place] FDA and has been authorized for detection and/or diagnosis of SARS-CoV-2 by FDA under an Emergency Use Authorization (EUA). This EUA will remain in effect (meaning this test can be used) for  the duration of the COVID-19 declaration under Section 564(b)(1) of the Act, 21 U.S.C. section 360bbb-3(b)(1), unless the authorization is terminated or revoked.  Performed at [facility], [address] [place], [place] [address]      Patient was seen and examined on the day of discharge and was found to be in stable condition. Time coordinating discharge: 40 minutes including assessment and coordination of care, as well as examination of the patient.   SIGNED:  Noralee Stain, DO [facility] 08/22/2020, 2:32 PM

## 2020-08-22 NOTE — Plan of Care (Signed)
  Problem: Education: Goal: Knowledge of General Education information will improve Description: Including pain rating scale, medication(s)/side effects and non-pharmacologic comfort measures Outcome: Completed/Met   Problem: Clinical Measurements: Goal: Ability to maintain clinical measurements within normal limits will improve Outcome: Progressing Goal: Will remain free from infection Outcome: Completed/Met Goal: Diagnostic test results will improve Outcome: Completed/Met Goal: Respiratory complications will improve Outcome: Completed/Met Goal: Cardiovascular complication will be avoided Outcome: Progressing   Problem: Activity: Goal: Risk for activity intolerance will decrease Outcome: Completed/Met

## 2020-08-22 NOTE — Progress Notes (Signed)
VAST consulted to obtain IV access d/t patient coming off Cardizem drip this am. Upon arriving at patient's bedside she started crying and asking why she needed an IV again. Pt's nurse Greggory Brandy spoke with her and educated about possible emergent need for IV access. Pt started sobbing and stated she was tired of being a pin cushion and wanted to go home. Lia explained that her getting an IV does not mean she won't go home later is she remains stable. VAST RN verbalized if IV access is needed emergently, Lia can place consult stat for IV access and VAST RN will keep patient on radar for remainder of shift. Both patient and nurse agreeable after contacting and informing physician.

## 2020-08-22 NOTE — TOC Initial Note (Signed)
Transition of Care [facility]) - Initial/Assessment Note    Patient Details  Name: Joyce Perkins MRN: [id number] Date of Birth: 1975/06/01  Transition of Care [facility]) CM/SW Contact:    Liliana Cline, LCSW Phone Number: 08/22/2020, 3:15 PM  Clinical Narrative:                [facility] consult for medication assistance. Patient to discharge home today. Patient is uninsured. Spoke with spouse who confirmed patient still goes to [facility] for PCP. Patient uses Psychologist, forensic in [place]- CSW checked $4 list and patient's new meds (other than Viatminas and Eliquis) are on $4 list -- informed patient's spouse who verbalized understanding. Patient's RN reported she already provided an Eliquis card to patient. No other TOC needs identified prior to discharge.   Expected Discharge Plan: Home/Self Care Barriers to Discharge: Barriers Resolved   Patient Goals and CMS Choice        Expected Discharge Plan and Services Expected Discharge Plan: Home/Self Care         Expected Discharge Date: 08/22/20                                    Prior Living Arrangements/Services     Patient language and need for interpreter reviewed:: Yes Do you feel safe going back to the place where you live?: Yes      Need for Family Participation in Patient Care: Yes (Comment) Care giver support system in place?: Yes (comment)   Criminal Activity/Legal Involvement Pertinent to Current Situation/Hospitalization: No - Comment as needed  Activities of Daily Living Home Assistive Devices/Equipment: CPAP ADL Screening (condition at time of admission) Patient's cognitive ability adequate to safely complete daily activities?: Yes Is the patient deaf or have difficulty hearing?: No Does the patient have difficulty seeing, even when wearing glasses/contacts?: No Does the patient have difficulty concentrating, remembering, or making decisions?: No Patient able to express need for assistance with ADLs?:  Yes Does the patient have difficulty dressing or bathing?: No Independently performs ADLs?: Yes (appropriate for developmental age) Does the patient have difficulty walking or climbing stairs?: No Weakness of Legs: None Weakness of Arms/Hands: None  Permission Sought/Granted                  Emotional Assessment         Alcohol / Substance Use: Not Applicable Psych Involvement: No (comment)  Admission diagnosis:  Dyspnea on exertion [R06.00] Hypertensive urgency [I16.0] Atrial fibrillation with RVR (HCC) [I48.91] Patient Active Problem List   Diagnosis Date Noted  . Hypertensive urgency 08/21/2020  . Essential hypertension 08/21/2020  . Leukocytosis 08/21/2020  . Atrial fibrillation with RVR (HCC) 08/21/2020  . Obesity, morbid, BMI 50 or higher (HCC) 08/21/2020  . At risk for extreme obesity with alveolar hypoventilation 08/21/2020  . PCOS (polycystic ovarian syndrome) 08/21/2020   PCP:  Inc, [facility]:   [facility] [address] [facility], [place] - [address] [place] [address] Phone: [phone] Fax: [phone]     Social Determinants of Health (SDOH) Interventions    Readmission Risk Interventions No flowsheet data found.

## 2020-08-22 NOTE — Progress Notes (Signed)
Patient discharged per orders, tele removed. AVS reviewed with patient; expressed understanding. Taken down to medical mall via wheelchair.

## 2020-09-04 ENCOUNTER — Ambulatory Visit: Payer: Self-pay | Admitting: Nurse Practitioner

## 2021-04-15 ENCOUNTER — Ambulatory Visit: Payer: BC Managed Care – PPO | Admitting: Obstetrics and Gynecology

## 2021-04-15 ENCOUNTER — Other Ambulatory Visit: Payer: Self-pay

## 2021-04-15 ENCOUNTER — Encounter: Payer: Self-pay | Admitting: Obstetrics and Gynecology

## 2021-04-15 VITALS — BP 174/127 | HR 98 | Ht 64.0 in | Wt 323.1 lb

## 2021-04-15 DIAGNOSIS — N920 Excessive and frequent menstruation with regular cycle: Secondary | ICD-10-CM | POA: Diagnosis not present

## 2021-04-15 DIAGNOSIS — I482 Chronic atrial fibrillation, unspecified: Secondary | ICD-10-CM

## 2021-04-15 DIAGNOSIS — Z6841 Body Mass Index (BMI) 40.0 and over, adult: Secondary | ICD-10-CM

## 2021-04-15 NOTE — Progress Notes (Signed)
Patient complains of severe period clots. Patient states she has golf ball size clots and usually unable to leave the house day 4-7. Patient has a history of a 10 cm cyst during previous pregnancy.

## 2021-04-15 NOTE — Progress Notes (Signed)
HPI:      [name] is a 45 y.o. [id number] who LMP was Patient's last menstrual period was 03/26/2021 (exact date).  Subjective:   She presents today with complaint of very heavy menstrual bleeding that has become worse over the last 3 to 4 months.  The bleeding is accompanied by large clots and very painful cramping. Patient has a tubal ligation for birth control. Her menses are regular every month. She is currently taking antihypertensive medication. She also has a history of A. fib and has been on Eliquis for the last 6 months.     Hx: The following portions of the patient's history were reviewed and updated as appropriate:             She  has a past medical history of Anxiety, Gestational diabetes, Hypertension, Obesity, PCOS (polycystic ovarian syndrome), Persistent atrial fibrillation (HCC), Preeclampsia, and Sleep apnea. She does not have any pertinent problems on file. She  has a past surgical history that includes Cesarean section; Ovarian cyst removal (Left); and Salpingoophorectomy (Left). Her family history includes COPD in her mother; Diabetes in her mother; Heart disease in her mother; Hypertension in her mother; Other in her father. She  reports that she quit smoking about 20 years ago. Her smoking use included cigarettes. She has never used smokeless tobacco. She reports that she does not currently use alcohol. She reports that she does not currently use drugs. She has a current medication list which includes the following prescription(s): apixaban, lisinopril, multivitamin, vitamin d, biotin, and metoprolol tartrate. She has No Known Allergies.       Review of Systems:  Review of Systems  Constitutional: Denied constitutional symptoms, night sweats, recent illness, fatigue, fever, insomnia and weight loss.  Eyes: Denied eye symptoms, eye pain, photophobia, vision change and visual disturbance.  Ears/Nose/Throat/Neck: Denied ear, nose, throat or neck symptoms,  hearing loss, nasal discharge, sinus congestion and sore throat.  Cardiovascular: Denied cardiovascular symptoms, arrhythmia, chest pain/pressure, edema, exercise intolerance, orthopnea and palpitations.  Respiratory: Denied pulmonary symptoms, asthma, pleuritic pain, productive sputum, cough, dyspnea and wheezing.  Gastrointestinal: Denied, gastro-esophageal reflux, melena, nausea and vomiting.  Genitourinary: See HPI for additional information.  Musculoskeletal: Denied musculoskeletal symptoms, stiffness, swelling, muscle weakness and myalgia.  Dermatologic: Denied dermatology symptoms, rash and scar.  Neurologic: Denied neurology symptoms, dizziness, headache, neck pain and syncope.  Psychiatric: Denied psychiatric symptoms, anxiety and depression.  Endocrine: Denied endocrine symptoms including hot flashes and night sweats.   Meds:   Current Outpatient Medications on File Prior to Visit  Medication Sig Dispense Refill   apixaban (ELIQUIS) 5 MG TABS tablet Take 1 tablet (5 mg total) by mouth 2 (two) times daily. 60 tablet 2   lisinopril (ZESTRIL) 20 MG tablet Take 1 tablet (20 mg total) by mouth daily. 30 tablet 2   Multiple Vitamin (MULTIVITAMIN) tablet Take 1 tablet by mouth daily.     VITAMIN D PO Take 1 tablet by mouth daily.     BIOTIN PO Take 1 tablet by mouth daily. (Patient not taking: Reported on 04/15/2021)     metoprolol tartrate (LOPRESSOR) 50 MG tablet Take 1 tablet (50 mg total) by mouth 2 (two) times daily. (Patient not taking: Reported on 04/15/2021) 60 tablet 2   No current facility-administered medications on file prior to visit.      Objective:     Vitals:   04/15/21 0927  BP: (!) 174/127  Pulse: 98   Filed Weights   04/15/21  [id number]  Weight: (!) 323 lb 1.6 oz (146.6 kg)    Repeat blood pressure  141/80                    Assessment:    [id number] Patient Active Problem List   Diagnosis Date Noted   Hypertensive urgency 08/21/2020   Essential  hypertension 08/21/2020   Leukocytosis 08/21/2020   Atrial fibrillation with RVR ([place]) 08/21/2020   Obesity, morbid, BMI 50 or higher ([place]) 08/21/2020   At risk for extreme obesity with alveolar hypoventilation 08/21/2020   PCOS (polycystic ovarian syndrome) 08/21/2020     1. Menorrhagia with regular cycle   2. Chronic atrial fibrillation (HCC)   3. Morbid obesity with BMI of 50.0-59.9, adult [facility])     Patient is having significant bleeding and cramping over the last several months.  This may be a function of fibroids or other endometrial abnormality but is likely being exacerbated by the use of Eliquis for her A. fib.  Plan:            1.  CBC, TSH, hemoglobin A1c  2.  Patient to contact her PCP regarding blood pressure management  3.  Pelvic ultrasound  4.  Management of fibroids or heavy bleeding with menses discussed in detail including cycle control methods, fibroid embolization, endometrial ablation, and hysterectomy.  Orders Orders Placed This Encounter  Procedures   US PELVIS TRANSVAGINAL NON-OB (TV ONLY)   US PELVIS (TRANSABDOMINAL ONLY)   CBC   TSH   Hemoglobin A1c    No orders of the defined types were placed in this encounter.     F/U  Return in about 4 weeks (around 05/13/2021). I spent 33 minutes involved in the care of this patient preparing to see the patient by obtaining and reviewing her medical history (including labs, imaging tests and prior procedures), documenting clinical information in the electronic health record (EHR), counseling and coordinating care plans, writing and sending prescriptions, ordering tests or procedures and in direct communicating with the patient and medical staff discussing pertinent items from her history and physical exam.  Finis Bud, M.D. 04/15/2021 9:53 AM

## 2021-04-16 LAB — HEMOGLOBIN A1C
Est. average glucose Bld gHb Est-mCnc: 140 mg/dL
Hgb A1c MFr Bld: 6.5 % — ABNORMAL HIGH (ref 4.8–5.6)

## 2021-04-16 LAB — CBC
Hematocrit: 40.6 % (ref 34.0–46.6)
Hemoglobin: 13.2 g/dL (ref 11.1–15.9)
MCH: 27.8 pg (ref 26.6–33.0)
MCHC: 32.5 g/dL (ref 31.5–35.7)
MCV: 86 fL (ref 79–97)
Platelets: 406 10*3/uL (ref 150–450)
RBC: 4.74 x10E6/uL (ref 3.77–5.28)
RDW: 13.3 % (ref 11.7–15.4)
WBC: 15.7 10*3/uL — ABNORMAL HIGH (ref 3.4–10.8)

## 2021-04-16 LAB — TSH: TSH: 1.91 u[IU]/mL (ref 0.450–4.500)

## 2021-04-22 ENCOUNTER — Other Ambulatory Visit: Payer: Self-pay | Admitting: Obstetrics and Gynecology

## 2021-04-22 ENCOUNTER — Other Ambulatory Visit: Payer: Self-pay

## 2021-04-22 ENCOUNTER — Ambulatory Visit (INDEPENDENT_AMBULATORY_CARE_PROVIDER_SITE_OTHER): Payer: BC Managed Care – PPO

## 2021-04-22 DIAGNOSIS — N920 Excessive and frequent menstruation with regular cycle: Secondary | ICD-10-CM

## 2021-04-27 ENCOUNTER — Telehealth: Payer: Self-pay | Admitting: Obstetrics and Gynecology

## 2021-04-27 NOTE — Telephone Encounter (Signed)
Pt is calling in stating that she would like to have her [place] results.  Pt is aware as soon as the provider results them someone from the office will give her a call with the results.

## 2021-04-28 NOTE — Progress Notes (Signed)
Although this ultrasound is not entirely normal-there is a small cyst on your right ovary-it does not explain why you are having increased bleeding and cramping with her menstrual periods.  I would recommend some type of cycle control method to see if we can decrease her bleeding and cramping specifically.  Use of IUD, Joyce Perkins, or possibly an endometrial ablation might be possible.  We can speak about it at another visit or I can see you for a virtual visit to discuss this.

## 2021-05-06 ENCOUNTER — Encounter: Payer: Self-pay | Admitting: Obstetrics and Gynecology

## 2021-05-06 ENCOUNTER — Telehealth: Payer: Self-pay | Admitting: Obstetrics and Gynecology

## 2021-05-06 NOTE — Telephone Encounter (Signed)
Pt called stating that she thinks she has a possible UTI - states that she has been having pain and pressure burning when she pees and freq. urge to urinate, pt also states a fever of 100.2. Pt states pharmacy [facility] in [place]. Please Advise.

## 2021-05-07 ENCOUNTER — Other Ambulatory Visit: Payer: BC Managed Care – PPO

## 2021-05-07 NOTE — Telephone Encounter (Signed)
Pt called and has been scheduled a nurse visit.

## 2021-05-13 ENCOUNTER — Encounter: Payer: BC Managed Care – PPO | Admitting: Obstetrics and Gynecology

## 2021-05-14 ENCOUNTER — Encounter: Payer: Self-pay | Admitting: Obstetrics and Gynecology

## 2021-12-21 ENCOUNTER — Ambulatory Visit (INDEPENDENT_AMBULATORY_CARE_PROVIDER_SITE_OTHER): Payer: 59

## 2021-12-21 ENCOUNTER — Ambulatory Visit: Payer: 59

## 2021-12-21 ENCOUNTER — Ambulatory Visit
Admission: EM | Admit: 2021-12-21 | Discharge: 2021-12-21 | Disposition: A | Discharge status: Home / Self Care | Payer: 59 | Attending: Nurse Practitioner | Admitting: Nurse Practitioner

## 2021-12-21 DIAGNOSIS — M25552 Pain in left hip: Secondary | ICD-10-CM | POA: Diagnosis not present

## 2021-12-21 DIAGNOSIS — M546 Pain in thoracic spine: Secondary | ICD-10-CM

## 2021-12-21 DIAGNOSIS — M5441 Lumbago with sciatica, right side: Secondary | ICD-10-CM | POA: Diagnosis not present

## 2021-12-21 DIAGNOSIS — M545 Low back pain, unspecified: Secondary | ICD-10-CM | POA: Diagnosis not present

## 2021-12-21 DIAGNOSIS — M25551 Pain in right hip: Secondary | ICD-10-CM

## 2021-12-21 DIAGNOSIS — M5442 Lumbago with sciatica, left side: Secondary | ICD-10-CM

## 2021-12-21 MED ORDER — TIZANIDINE HCL 4 MG PO TABS: 4.0000 mg | ORAL_TABLET | Freq: Three times a day (TID) | ORAL | 0 refills | Status: AC

## 2021-12-21 MED ORDER — IBUPROFEN 800 MG PO TABS: 800.0000 mg | ORAL_TABLET | Freq: Three times a day (TID) | ORAL | 0 refills | Status: AC

## 2021-12-21 NOTE — Discharge Instructions (Addendum)
Upper back xray Degenerative changes.  No acute process. Lower back xrayDegenerative changes.  No acute process. Hip xray; left  Degenerative changes.  No acute process.  Right hip negative Ibuprofen 800 mg for inflammation and pain Tizanidine 4 mg as needed for muscle tightness or spasms If symptoms persist or get worse may consider follow up with orthopedics Referral for physical therapy
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
## 2023-04-21 ENCOUNTER — Encounter: Payer: Self-pay | Admitting: Podiatry

## 2023-04-21 ENCOUNTER — Ambulatory Visit (INDEPENDENT_AMBULATORY_CARE_PROVIDER_SITE_OTHER): Payer: Commercial Managed Care - PPO | Admitting: Podiatry

## 2023-04-21 VITALS — Ht 64.0 in | Wt 323.0 lb

## 2023-04-21 DIAGNOSIS — Z79899 Other long term (current) drug therapy: Secondary | ICD-10-CM

## 2023-04-21 DIAGNOSIS — B351 Tinea unguium: Secondary | ICD-10-CM | POA: Diagnosis not present

## 2023-04-21 NOTE — Progress Notes (Signed)
  Subjective:  Patient ID: Joyce Perkins, female    DOB: 26-Apr-1976,  MRN: [id number]  Chief Complaint  Patient presents with   Nail Problem    Pt is here due to left toenail problem to her greater and pinky toenail, pt states some discoloration to both nails    47 y.o. female presents with the above complaint.  Patient presents with left hallux and fifth digit thickened elongated dystrophic mycotic nail x 2.  Patient states that there is some discoloration associate with this been present for quite some time she wanted get it evaluated she has not seen anyone else prior to seeing me for this.  She has tried topical medication which has not helped she would like to discuss oral medication   Review of Systems: Negative except as noted in the HPI. Denies N/V/F/Ch.  Past Medical History:  Diagnosis Date   Anxiety    Gestational diabetes    Hypertension    Obesity    PCOS (polycystic ovarian syndrome)    Persistent atrial fibrillation (HCC)    a. Dx 08/2020.  CHA2DS2VASc = 2.   Preeclampsia    a. w/ prior pregnancies.   Sleep apnea     Current Outpatient Medications:    ibuprofen (ADVIL) 800 MG tablet, Take 1 tablet (800 mg total) by mouth 3 (three) times daily., Disp: 21 tablet, Rfl: 0  Social History   Tobacco Use  Smoking Status Former   Current packs/day: 0.00   Types: Cigarettes   Quit date: 08/21/2000   Years since quitting: 22.6  Smokeless Tobacco Never    No Known Allergies Objective:  There were no vitals filed for this visit. Body mass index is 55.44 kg/m. Constitutional Well developed. Well nourished.  Vascular Dorsalis pedis pulses palpable bilaterally. Posterior tibial pulses palpable bilaterally. Capillary refill normal to all digits.  No cyanosis or clubbing noted. Pedal hair growth normal.  Neurologic Normal speech. Oriented to person, place, and time. Epicritic sensation to light touch grossly present bilaterally.  Dermatologic Nails thickened and  onychodystrophy mycotic nail x 2 left hallux and fifth digit Skin within normal limits  Orthopedic: Normal joint ROM without pain or crepitus bilaterally. No visible deformities. No bony tenderness.   Radiographs: None Assessment:   1. High risk medications (not anticoagulants) long-term use   2. Nail fungus   3. Onychomycosis due to dermatophyte    Plan:  Patient was evaluated and treated and all questions answered.  Left hallux onychomycosis -Educated the patient on the etiology of onychomycosis and various treatment options associated with improving the fungal load.  I explained to the patient that there is 3 treatment options available to treat the onychomycosis including topical, p.o., laser treatment.  Patient elected to undergo p.o. options with Lamisil/terbinafine therapy.  In order for me to start the medication therapy, I explained to the patient the importance of evaluating the liver and obtaining the liver function test.  Once the liver function test comes back normal I will start him on 32-month course of Lamisil therapy.  Patient understood all risk and would like to proceed with Lamisil therapy.  I have asked the patient to immediately stop the Lamisil therapy if she has any reactions to it and call the office or go to the emergency room right away.  Patient states understanding   No follow-ups on file.

## 2023-04-22 LAB — HEPATIC FUNCTION PANEL
ALT: 17 [IU]/L (ref 0–32)
AST: 36 [IU]/L (ref 0–40)
Albumin: 4.2 g/dL (ref 3.9–4.9)
Alkaline Phosphatase: 116 [IU]/L (ref 44–121)
Bilirubin Total: 0.3 mg/dL (ref 0.0–1.2)
Bilirubin, Direct: 0.11 mg/dL (ref 0.00–0.40)
Total Protein: 7.8 g/dL (ref 6.0–8.5)

## 2023-04-22 MED ORDER — TERBINAFINE HCL 250 MG PO TABS: 250.0000 mg | ORAL_TABLET | Freq: Every day | ORAL | 0 refills | Status: AC

## 2023-04-22 NOTE — Addendum Note (Signed)
Addended by: Nicholes Rough on: 04/22/2023 07:59 AM   Modules accepted: Orders

## 2023-12-20 ENCOUNTER — Ambulatory Visit (INDEPENDENT_AMBULATORY_CARE_PROVIDER_SITE_OTHER): Admitting: Podiatry

## 2023-12-20 DIAGNOSIS — M722 Plantar fascial fibromatosis: Secondary | ICD-10-CM | POA: Diagnosis not present

## 2023-12-20 DIAGNOSIS — M62462 Contracture of muscle, left lower leg: Secondary | ICD-10-CM

## 2023-12-20 NOTE — Progress Notes (Signed)
 Subjective:  Patient ID: Joyce Perkins, female    DOB: 04-01-1976,  MRN: [id number]  Chief Complaint  Patient presents with   Foot Pain    Left heel pain for about  a month pt stated that it is worse in the morning but does get a little better after being on it     48 y.o. female presents with the above complaint.  Patient presents with left heel pain that has been on for quite some time is progressive gotten worse worse with ambulation is with pressure she has not seen anyone as prior to seeing me.  She would like to discuss treatment options for it pain scale 7 out of 10 dull aching nature.   Review of Systems: Negative except as noted in the HPI. Denies N/V/F/Ch.  Past Medical History:  Diagnosis Date   Anxiety    Gestational diabetes    Hypertension    Obesity    PCOS (polycystic ovarian syndrome)    Persistent atrial fibrillation (HCC)    a. Dx 08/2020.  CHA2DS2VASc = 2.   Preeclampsia    a. w/ prior pregnancies.   Sleep apnea     Current Outpatient Medications:    amiodarone (PACERONE) 200 MG tablet, Take 200 mg by mouth daily., Disp: , Rfl:    amiodarone (PACERONE) 400 MG tablet, Take 400 mg by mouth 2 (two) times daily., Disp: , Rfl:    clonazePAM (KLONOPIN) 0.5 MG tablet, Take 0.5 mg by mouth., Disp: , Rfl:    gabapentin (NEURONTIN) 300 MG capsule, Take 300 mg by mouth., Disp: , Rfl:    lidocaine (XYLOCAINE) 5 % ointment, SMARTSIG:1 sparingly Topical 3 Times Daily PRN, Disp: , Rfl:    Lidocaine-Hydrocortisone Ace 2.8-0.55 % GEL, Place rectally., Disp: , Rfl:    metroNIDAZOLE (METROGEL) 0.75 % vaginal gel, Insert 1 applicatorful into vagina for 5 nights, Disp: , Rfl:    oxyCODONE-acetaminophen  (PERCOCET/ROXICET) 5-325 MG tablet, Take by mouth., Disp: , Rfl:    sertraline (ZOLOFT) 25 MG tablet, Take 1 tablet by mouth at bedtime., Disp: , Rfl:    acetaminophen  (TYLENOL ) 325 MG tablet, Take by mouth., Disp: , Rfl:    carvedilol (COREG) 25 MG tablet, Take 25 mg by mouth 2  (two) times daily., Disp: , Rfl:    ELIQUIS  5 MG TABS tablet, Take 5 mg by mouth 2 (two) times daily., Disp: , Rfl:    furosemide  (LASIX ) 40 MG tablet, Take 40 mg by mouth daily., Disp: , Rfl:    ibuprofen  (ADVIL ) 800 MG tablet, Take 1 tablet (800 mg total) by mouth 3 (three) times daily., Disp: 21 tablet, Rfl: 0   JARDIANCE 10 MG TABS tablet, Take 10 mg by mouth daily., Disp: , Rfl:    lisinopril  (ZESTRIL ) 40 MG tablet, Take 40 mg by mouth daily., Disp: , Rfl:    spironolactone (ALDACTONE) 25 MG tablet, Take 25 mg by mouth daily., Disp: , Rfl:    terbinafine  (LAMISIL ) 250 MG tablet, Take 1 tablet (250 mg total) by mouth daily., Disp: 90 tablet, Rfl: 0  Social History   Tobacco Use  Smoking Status Former   Current packs/day: 0.00   Types: Cigarettes   Quit date: 08/21/2000   Years since quitting: 23.3  Smokeless Tobacco Never    No Known Allergies Objective:  There were no vitals filed for this visit. There is no height or weight on file to calculate BMI. Constitutional Well developed. Well nourished.  Vascular Dorsalis pedis pulses palpable bilaterally.  Posterior tibial pulses palpable bilaterally. Capillary refill normal to all digits.  No cyanosis or clubbing noted. Pedal hair growth normal.  Neurologic Normal speech. Oriented to person, place, and time. Epicritic sensation to light touch grossly present bilaterally.  Dermatologic Nails well groomed and normal in appearance. No open wounds. No skin lesions.  Orthopedic: Normal joint ROM without pain or crepitus bilaterally. No visible deformities. Tender to palpation at the calcaneal tuber left. No pain with calcaneal squeeze left. Ankle ROM diminished range of motion left. Silfverskiold Test: positive left.   Radiographs: None  Assessment:   1. Plantar fasciitis of left foot   2. Gastrocnemius equinus, left    Plan:  Patient was evaluated and treated and all questions answered.  Plantar Fasciitis, left with  underlying gastrocnemius equinus - XR reviewed as above.  - Educated on icing and stretching. Instructions given.  - Injection delivered to the plantar fascia as below. - DME: Plantar fascial brace dispensed to support the medial longitudinal arch of the foot and offload pressure from the heel and prevent arch collapse during weightbearing - Pharmacologic management: None  Procedure: Injection Tendon/Ligament Location: Left plantar fascia at the glabrous junction; medial approach. Skin Prep: alcohol Injectate: 0.5 cc 0.5% marcaine plain, 0.5 cc of 1% Lidocaine, 0.5 cc kenalog 10. Disposition: Patient tolerated procedure well. Injection site dressed with a band-aid.  No follow-ups on file.

## 2023-12-27 ENCOUNTER — Encounter: Payer: Self-pay | Admitting: Podiatry

## 2024-01-24 ENCOUNTER — Ambulatory Visit: Admitting: Podiatry
